# Patient Record
Sex: Female | Born: 1980 | Race: Black or African American | Hispanic: No | Marital: Single | Smoking: Former smoker
Health system: Southern US, Community
[De-identification: ages and names within clinical notes are randomized; demographics above are authoritative.]

## PROBLEM LIST (undated history)

## (undated) DIAGNOSIS — D573 Sickle-cell trait: Secondary | ICD-10-CM

## (undated) DIAGNOSIS — E282 Polycystic ovarian syndrome: Secondary | ICD-10-CM

## (undated) DIAGNOSIS — B009 Herpesviral infection, unspecified: Secondary | ICD-10-CM

## (undated) DIAGNOSIS — F419 Anxiety disorder, unspecified: Secondary | ICD-10-CM

## (undated) DIAGNOSIS — I10 Essential (primary) hypertension: Secondary | ICD-10-CM

## (undated) DIAGNOSIS — R319 Hematuria, unspecified: Secondary | ICD-10-CM

## (undated) DIAGNOSIS — E119 Type 2 diabetes mellitus without complications: Secondary | ICD-10-CM

## (undated) HISTORY — PX: FINGER SURGERY: SHX640

## (undated) HISTORY — PX: INDUCED ABORTION: SHX677

---

## 1999-08-07 ENCOUNTER — Other Ambulatory Visit: Admission: RE | Admit: 1999-08-07 | Discharge: 1999-08-07 | Payer: Self-pay | Admitting: Obstetrics and Gynecology

## 1999-08-08 ENCOUNTER — Other Ambulatory Visit: Admission: RE | Admit: 1999-08-08 | Discharge: 1999-08-08 | Payer: Self-pay | Admitting: Obstetrics and Gynecology

## 1999-12-10 ENCOUNTER — Other Ambulatory Visit: Admission: RE | Admit: 1999-12-10 | Discharge: 1999-12-10 | Payer: Self-pay | Admitting: Obstetrics and Gynecology

## 1999-12-10 ENCOUNTER — Encounter (INDEPENDENT_AMBULATORY_CARE_PROVIDER_SITE_OTHER): Payer: Self-pay

## 2000-04-09 ENCOUNTER — Other Ambulatory Visit: Admission: RE | Admit: 2000-04-09 | Discharge: 2000-04-09 | Payer: Self-pay | Admitting: Obstetrics and Gynecology

## 2004-01-16 ENCOUNTER — Inpatient Hospital Stay (HOSPITAL_COMMUNITY): Admission: AD | Admit: 2004-01-16 | Discharge: 2004-01-16 | Payer: Self-pay | Admitting: Obstetrics

## 2004-01-17 ENCOUNTER — Inpatient Hospital Stay (HOSPITAL_COMMUNITY): Admission: AD | Admit: 2004-01-17 | Discharge: 2004-01-20 | Payer: Self-pay | Admitting: Obstetrics

## 2006-02-25 ENCOUNTER — Emergency Department (HOSPITAL_COMMUNITY): Admission: EM | Admit: 2006-02-25 | Discharge: 2006-02-25 | Payer: Self-pay | Admitting: Family Medicine

## 2009-10-04 ENCOUNTER — Emergency Department (HOSPITAL_COMMUNITY): Admission: EM | Admit: 2009-10-04 | Discharge: 2009-10-04 | Payer: Self-pay | Admitting: Family Medicine

## 2009-10-08 ENCOUNTER — Ambulatory Visit (HOSPITAL_COMMUNITY): Admission: RE | Admit: 2009-10-08 | Discharge: 2009-10-08 | Payer: Self-pay | Admitting: Orthopedic Surgery

## 2009-10-09 ENCOUNTER — Ambulatory Visit (HOSPITAL_BASED_OUTPATIENT_CLINIC_OR_DEPARTMENT_OTHER): Admission: RE | Admit: 2009-10-09 | Discharge: 2009-10-09 | Payer: Self-pay | Admitting: Orthopedic Surgery

## 2010-02-02 ENCOUNTER — Encounter: Payer: Self-pay | Admitting: Obstetrics & Gynecology

## 2010-03-28 LAB — CBC
HCT: 36.3 % (ref 36.0–46.0)
Hemoglobin: 12.3 g/dL (ref 12.0–15.0)
MCH: 26.6 pg (ref 26.0–34.0)
MCHC: 33.9 g/dL (ref 30.0–36.0)
MCV: 78.6 fL (ref 78.0–100.0)
Platelets: 264 10*3/uL (ref 150–400)
RBC: 4.62 MIL/uL (ref 3.87–5.11)
RDW: 16.1 % — ABNORMAL HIGH (ref 11.5–15.5)
WBC: 15.3 10*3/uL — ABNORMAL HIGH (ref 4.0–10.5)

## 2010-03-28 LAB — BASIC METABOLIC PANEL
BUN: 3 mg/dL — ABNORMAL LOW (ref 6–23)
CO2: 26 mEq/L (ref 19–32)
Calcium: 8.9 mg/dL (ref 8.4–10.5)
Chloride: 108 mEq/L (ref 96–112)
Creatinine, Ser: 0.84 mg/dL (ref 0.4–1.2)
GFR calc Af Amer: >60 mL/min (ref >60)
GFR calc non Af Amer: >60 mL/min (ref >60)
Glucose, Bld: 100 mg/dL — ABNORMAL HIGH (ref 70–99)
Potassium: 4 mEq/L (ref 3.5–5.1)
Sodium: 138 mEq/L (ref 135–145)

## 2012-12-30 ENCOUNTER — Other Ambulatory Visit: Payer: Self-pay | Admitting: Nurse Practitioner

## 2012-12-30 DIAGNOSIS — R109 Unspecified abdominal pain: Secondary | ICD-10-CM

## 2013-01-03 ENCOUNTER — Ambulatory Visit
Admission: RE | Admit: 2013-01-03 | Discharge: 2013-01-03 | Disposition: A | Discharge status: Home / Self Care | Payer: BC Managed Care – PPO | Source: Ambulatory Visit | Attending: Nurse Practitioner | Admitting: Nurse Practitioner

## 2013-01-03 ENCOUNTER — Other Ambulatory Visit: Payer: Self-pay | Admitting: Nurse Practitioner

## 2013-01-03 ENCOUNTER — Other Ambulatory Visit: Payer: Self-pay

## 2013-01-03 DIAGNOSIS — R109 Unspecified abdominal pain: Secondary | ICD-10-CM

## 2013-01-03 DIAGNOSIS — R319 Hematuria, unspecified: Secondary | ICD-10-CM

## 2013-11-16 ENCOUNTER — Other Ambulatory Visit: Payer: Self-pay | Admitting: Gastroenterology

## 2013-11-16 DIAGNOSIS — R1011 Right upper quadrant pain: Secondary | ICD-10-CM

## 2013-12-05 ENCOUNTER — Ambulatory Visit (HOSPITAL_COMMUNITY)
Admission: RE | Admit: 2013-12-05 | Discharge: 2013-12-05 | Disposition: A | Discharge status: Home / Self Care | Payer: BC Managed Care – PPO | Source: Ambulatory Visit | Attending: Gastroenterology | Admitting: Gastroenterology

## 2013-12-05 ENCOUNTER — Encounter (HOSPITAL_COMMUNITY)
Admission: RE | Admit: 2013-12-05 | Discharge: 2013-12-05 | Disposition: A | Discharge status: Home / Self Care | Payer: BC Managed Care – PPO | Source: Ambulatory Visit | Attending: Gastroenterology | Admitting: Gastroenterology

## 2013-12-05 DIAGNOSIS — R1011 Right upper quadrant pain: Secondary | ICD-10-CM | POA: Insufficient documentation

## 2013-12-05 MED ORDER — STERILE WATER FOR INJECTION IJ SOLN
INTRAMUSCULAR | Status: AC
  Filled 2013-12-05: qty 10

## 2013-12-05 MED ORDER — TECHNETIUM TC 99M MEBROFENIN IV KIT: 5.0000 | PACK | Freq: Once | INTRAVENOUS | Status: AC | PRN

## 2013-12-05 MED ORDER — SINCALIDE 5 MCG IJ SOLR
INTRAMUSCULAR | Status: AC
  Administered 2013-12-05: 1.99 ug
  Filled 2013-12-05: qty 5

## 2013-12-14 ENCOUNTER — Encounter (HOSPITAL_COMMUNITY): Admission: RE | Payer: Self-pay | Source: Ambulatory Visit

## 2013-12-14 ENCOUNTER — Ambulatory Visit (HOSPITAL_COMMUNITY)
Admission: RE | Admit: 2013-12-14 | Payer: BC Managed Care – PPO | Source: Ambulatory Visit | Admitting: Obstetrics & Gynecology

## 2013-12-14 SURGERY — ROBOTIC ASSISTED LAPAROSCOPIC OVARIAN CYSTECTOMY
Anesthesia: General | Laterality: Right

## 2014-12-05 ENCOUNTER — Other Ambulatory Visit: Payer: Self-pay | Admitting: Urology

## 2014-12-13 ENCOUNTER — Encounter (HOSPITAL_COMMUNITY): Admission: RE | Payer: Self-pay | Source: Ambulatory Visit

## 2014-12-13 ENCOUNTER — Ambulatory Visit (HOSPITAL_COMMUNITY): Admission: RE | Admit: 2014-12-13 | Payer: BC Managed Care – PPO | Source: Ambulatory Visit | Admitting: Urology

## 2014-12-13 SURGERY — CYSTOURETEROSCOPY, WITH RETROGRADE PYELOGRAM AND STENT INSERTION
Anesthesia: General | Laterality: Bilateral

## 2015-09-22 ENCOUNTER — Ambulatory Visit (HOSPITAL_COMMUNITY)
Admission: EM | Admit: 2015-09-22 | Discharge: 2015-09-22 | Discharge status: Absent without leave | Payer: BC Managed Care – PPO

## 2015-09-22 ENCOUNTER — Encounter (HOSPITAL_COMMUNITY): Payer: Self-pay | Admitting: *Deleted

## 2015-09-22 ENCOUNTER — Inpatient Hospital Stay (HOSPITAL_COMMUNITY)
Admission: AD | Admit: 2015-09-22 | Discharge: 2015-09-22 | Disposition: A | Discharge status: Home / Self Care | Payer: BC Managed Care – PPO | Source: Ambulatory Visit | Attending: Obstetrics and Gynecology | Admitting: Obstetrics and Gynecology

## 2015-09-22 DIAGNOSIS — D573 Sickle-cell trait: Secondary | ICD-10-CM | POA: Diagnosis not present

## 2015-09-22 DIAGNOSIS — Z8249 Family history of ischemic heart disease and other diseases of the circulatory system: Secondary | ICD-10-CM | POA: Insufficient documentation

## 2015-09-22 DIAGNOSIS — Z3202 Encounter for pregnancy test, result negative: Secondary | ICD-10-CM | POA: Diagnosis not present

## 2015-09-22 DIAGNOSIS — Z7984 Long term (current) use of oral hypoglycemic drugs: Secondary | ICD-10-CM | POA: Diagnosis not present

## 2015-09-22 DIAGNOSIS — Z87891 Personal history of nicotine dependence: Secondary | ICD-10-CM | POA: Insufficient documentation

## 2015-09-22 DIAGNOSIS — E282 Polycystic ovarian syndrome: Secondary | ICD-10-CM | POA: Insufficient documentation

## 2015-09-22 DIAGNOSIS — R51 Headache: Secondary | ICD-10-CM | POA: Diagnosis not present

## 2015-09-22 DIAGNOSIS — N939 Abnormal uterine and vaginal bleeding, unspecified: Secondary | ICD-10-CM | POA: Diagnosis not present

## 2015-09-22 DIAGNOSIS — Z91040 Latex allergy status: Secondary | ICD-10-CM | POA: Insufficient documentation

## 2015-09-22 DIAGNOSIS — Z8619 Personal history of other infectious and parasitic diseases: Secondary | ICD-10-CM | POA: Diagnosis not present

## 2015-09-22 DIAGNOSIS — IMO0001 Reserved for inherently not codable concepts without codable children: Secondary | ICD-10-CM

## 2015-09-22 DIAGNOSIS — Z823 Family history of stroke: Secondary | ICD-10-CM | POA: Insufficient documentation

## 2015-09-22 DIAGNOSIS — N938 Other specified abnormal uterine and vaginal bleeding: Secondary | ICD-10-CM | POA: Diagnosis not present

## 2015-09-22 DIAGNOSIS — Z880 Allergy status to penicillin: Secondary | ICD-10-CM | POA: Insufficient documentation

## 2015-09-22 HISTORY — DX: Hematuria, unspecified: R31.9

## 2015-09-22 HISTORY — DX: Sickle-cell trait: D57.3

## 2015-09-22 HISTORY — DX: Polycystic ovarian syndrome: E28.2

## 2015-09-22 HISTORY — DX: Herpesviral infection, unspecified: B00.9

## 2015-09-22 LAB — URINALYSIS, ROUTINE W REFLEX MICROSCOPIC
Bilirubin Urine: NEGATIVE
Glucose, UA: NEGATIVE mg/dL
Ketones, ur: NEGATIVE mg/dL
Leukocytes, UA: NEGATIVE
Nitrite: NEGATIVE
Protein, ur: NEGATIVE mg/dL
Specific Gravity, Urine: 1.015 (ref 1.005–1.030)
pH: 6.5 (ref 5.0–8.0)

## 2015-09-22 LAB — URINE MICROSCOPIC-ADD ON

## 2015-09-22 LAB — CBC
HCT: 33 % — ABNORMAL LOW (ref 36.0–46.0)
Hemoglobin: 11.1 g/dL — ABNORMAL LOW (ref 12.0–15.0)
MCH: 25.7 pg — ABNORMAL LOW (ref 26.0–34.0)
MCHC: 33.6 g/dL (ref 30.0–36.0)
MCV: 76.4 fL — ABNORMAL LOW (ref 78.0–100.0)
Platelets: 315 10*3/uL (ref 150–400)
RBC: 4.32 MIL/uL (ref 3.87–5.11)
RDW: 17 % — ABNORMAL HIGH (ref 11.5–15.5)
WBC: 9.1 10*3/uL (ref 4.0–10.5)

## 2015-09-22 LAB — POCT PREGNANCY, URINE: Preg Test, Ur: NEGATIVE

## 2015-09-22 LAB — HCG, SERUM, QUALITATIVE: Preg, Serum: NEGATIVE

## 2015-09-22 NOTE — MAU Provider Note (Signed)
History     CSN: [id number]  Arrival date and time: 09/22/15 [id number]   First Provider Initiated Contact with Patient 09/22/15 2032       Chief Complaint  Patient presents with  . Possible Pregnancy  . Vaginal Bleeding   HPI  Dawn Friedman is a 35 y.o. 112P1011 female who presents with vaginal bleeding. LMP 08/26/15; had negative pregnancy tests at home. Vaginal spotting since Thursday that increased today. Thought she was pregnant d/t headaches, fatigue, nausea and "smells bothering her". Was on OCPs for AUB control but stopped taking them in June d/t continued bleeding.  Goes to [facility] for gyn care; last seen there Summer 2016.    OB History    Gravida Para Term Preterm AB Living   2 1 1   1 1    SAB TAB Ectopic Multiple Live Births     1     1      Past Medical History:  Diagnosis Date  . Hematuria   . HSV infection   . PCOS (polycystic ovarian syndrome)   . Sickle cell trait [facility](HCC)     Past Surgical History:  Procedure Laterality Date  . FINGER SURGERY    . INDUCED ABORTION      Family History  Problem Relation Age of Onset  . Cancer Mother   . Cancer Father   . Heart disease Father   . COPD Father   . Stroke Father   . Stroke Brother     Social History  Substance Use Topics  . Smoking status: Former Games developermoker  . Smokeless tobacco: Never Used  . Alcohol use Yes     Comment: social    Allergies:  Allergies  Allergen Reactions  . Amoxicillin Rash    Has patient had a PCN reaction causing immediate rash, facial/tongue/throat swelling, SOB or lightheadedness with hypotension: No Has patient had a PCN reaction causing severe rash involving mucus membranes or skin necrosis: No Has patient had a PCN reaction that required hospitalization No Has patient had a PCN reaction occurring within the last 10 years: No If all of the above answers are "NO", then may proceed with Cephalosporin use.   . Latex Other (See Comments)    Irritation and dries her skin out     Prescriptions Prior to Admission  Medication Sig Dispense Refill Last Dose  . acetaminophen (TYLENOL) 325 MG suppository Place 325 mg rectally every 4 (four) hours as needed.     Marland Kitchen. acetaminophen (TYLENOL) 325 MG tablet Take 650 mg by mouth every 6 (six) hours as needed.   Past Week at Unknown time  . ibuprofen (ADVIL,MOTRIN) 800 MG tablet Take 800 mg by mouth every 8 (eight) hours as needed. Pain  0 Past Month at Unknown time  . pantoprazole (PROTONIX) 20 MG tablet Take 20 mg by mouth daily.   Past Month at Unknown time  . polyethylene glycol (MIRALAX / GLYCOLAX) packet Take 17 g by mouth daily.   Past Week at Unknown time  . venlafaxine XR (EFFEXOR-XR) 75 MG 24 hr capsule Take 75 mg by mouth daily with breakfast.   Past Week at Unknown time  . vitamin B-12 (CYANOCOBALAMIN) 100 MCG tablet Take 100 mcg by mouth daily.   09/21/2015 at Unknown time  . JUNEL FE 1/20 1-20 MG-MCG tablet Take 1 tablet by mouth daily.  11   . Melatonin 3 MG TABS Take 3 mg by mouth at bedtime.     . metFORMIN (GLUCOPHAGE-XR) 750  MG 24 hr tablet Take 750 mg by mouth daily with breakfast.     . spironolactone (ALDACTONE) 50 MG tablet Take 50 mg by mouth daily.     . valACYclovir (VALTREX) 500 MG tablet Take 1,000 mg by mouth See admin instructions. Takes daily for 3-5 days for outbreaks  0 More than a month at Unknown time    Review of Systems  Constitutional: Positive for malaise/fatigue. Negative for fever and weight loss.  Gastrointestinal: Positive for nausea. Negative for abdominal pain, constipation, diarrhea and vomiting.  Genitourinary: Negative for dysuria.       + vaginal bleeding  Neurological: Positive for headaches.   Physical Exam   Blood pressure 132/92, pulse 69, temperature 98.2 F (36.8 C), resp. rate 18, height 5\' 7"  (1.702 m), weight 159 lb 12.8 oz (72.5 kg).  Physical Exam  Nursing note and vitals reviewed. Constitutional: She is oriented to person, place, and time. She appears  well-developed and well-nourished. No distress.  HENT:  Head: Normocephalic and atraumatic.  Eyes: Conjunctivae are normal. Right eye exhibits no discharge. Left eye exhibits no discharge. No scleral icterus.  Neck: Normal range of motion.  Cardiovascular: Normal rate, regular rhythm and normal heart sounds.   No murmur heard. Respiratory: Effort normal and breath sounds normal. No respiratory distress. She has no wheezes.  GI: Soft. Bowel sounds are normal. She exhibits no distension. There is no tenderness.  Neurological: She is alert and oriented to person, place, and time.  Skin: Skin is warm and dry. She is not diaphoretic.  Psychiatric: She has a normal mood and affect. Her behavior is normal. Judgment and thought content normal.    MAU Course  Procedures Results for orders placed or performed during the hospital encounter of 09/22/15 (from the past 24 hour(s))  Urinalysis, Routine w reflex microscopic (not at [facility])     Status: Abnormal   Collection Time: 09/22/15  7:45 PM  Result Value Ref Range   Color, Urine YELLOW YELLOW   APPearance CLEAR CLEAR   Specific Gravity, Urine 1.015 1.005 - 1.030   pH 6.5 5.0 - 8.0   Glucose, UA NEGATIVE NEGATIVE mg/dL   Hgb urine dipstick SMALL (A) NEGATIVE   Bilirubin Urine NEGATIVE NEGATIVE   Ketones, ur NEGATIVE NEGATIVE mg/dL   Protein, ur NEGATIVE NEGATIVE mg/dL   Nitrite NEGATIVE NEGATIVE   Leukocytes, UA NEGATIVE NEGATIVE  Urine microscopic-add on     Status: Abnormal   Collection Time: 09/22/15  7:45 PM  Result Value Ref Range   Squamous Epithelial / LPF 0-5 (A) NONE SEEN   WBC, UA 0-5 0 - 5 WBC/hpf   RBC / HPF 6-30 0 - 5 RBC/hpf   Bacteria, UA RARE (A) NONE SEEN  Pregnancy, urine POC     Status: None   Collection Time: 09/22/15  8:08 PM  Result Value Ref Range   Preg Test, Ur NEGATIVE NEGATIVE  CBC     Status: Abnormal   Collection Time: 09/22/15  8:52 PM  Result Value Ref Range   WBC 9.1 4.0 - 10.5 K/uL   RBC 4.32 3.87 -  5.11 MIL/uL   Hemoglobin 11.1 (L) 12.0 - 15.0 g/dL   HCT 16.1 (L) 09.6 - 04.5 %   MCV 76.4 (L) 78.0 - 100.0 fL   MCH 25.7 (L) 26.0 - 34.0 pg   MCHC 33.6 30.0 - 36.0 g/dL   RDW 40.9 (H) 81.1 - 91.4 %   Platelets 315 150 - 400 K/uL  hCG,  serum, qualitative     Status: None   Collection Time: 09/22/15  8:52 PM  Result Value Ref Range   Preg, Serum NEGATIVE NEGATIVE    MDM UPT & serum preg negative CBC stable Pt declined exam S/w Dr. Billy Coast; ok to discharge home. Pt should call office for follow up.   Assessment and Plan  A: 1. Onset of menses   2. Negative pregnancy test     P: Discharge home Call office on Monday for f/u Take tylenol prn headaches    Judeth Horn 09/22/2015, 8:32 PM

## 2015-09-22 NOTE — Discharge Instructions (Signed)

## 2015-09-22 NOTE — MAU Note (Addendum)
Think I may be pregnant. Di d upt and was negative. Woke up this am and gushed blood. Everytime I pee since then I have seen blood and know is vaginal. LMP 8/13. Stopped birth control pills in June due to cont vag bleeding. Occ headaches, some back pain, occ cramping but no pain now. Hx R ovarian cyst and fibroids

## 2015-09-22 NOTE — Progress Notes (Signed)
Erin Lawrence NP in earlier to discuss test results and d/c plan. Written and verbal d/c instructions given and understanding voiced 

## 2019-01-14 NOTE — L&D Delivery Note (Signed)
Called with patient 9.5 cm and I arrived in 4 minutes to a precipitous delivery by the faculty provider. I took over before the cord was clamped. Delivery Note  SVD viable female Apgars 9,9 over intact perineum.  Placenta delivered spontaneously intact with 3VC. Good support and hemostasis noted.  R/V exam confirms.  PH art was sent.   Mother and baby to couplet care and are doing well.  EBL 100cc  [name], MD

## 2019-01-14 NOTE — L&D Delivery Note (Signed)
Delivery Note  Called to room by RN as patient was 9.5 cm and feeling urge to push. Dr. Rana Snare en route. At 6:11 PM a viable female was delivered via Vaginal, Spontaneous (Presentation:  OA    ).  APGAR: , 9; weight  Pending.  Dr. Rana Snare assumes care of  Patient before cord is cut and clamped.    Dawn Friedman 08/26/2019, 6:26 PM

## 2019-02-03 LAB — OB RESULTS CONSOLE GC/CHLAMYDIA
Chlamydia: NEGATIVE
Gonorrhea: NEGATIVE

## 2019-02-03 LAB — OB RESULTS CONSOLE HIV ANTIBODY (ROUTINE TESTING): HIV: NONREACTIVE

## 2019-02-03 LAB — OB RESULTS CONSOLE HEPATITIS B SURFACE ANTIGEN: Hepatitis B Surface Ag: NEGATIVE

## 2019-02-03 LAB — OB RESULTS CONSOLE ABO/RH: RH Type: POSITIVE

## 2019-02-03 LAB — OB RESULTS CONSOLE RUBELLA ANTIBODY, IGM: Rubella: IMMUNE

## 2019-02-03 LAB — OB RESULTS CONSOLE RPR: RPR: NONREACTIVE

## 2019-07-14 DIAGNOSIS — Z419 Encounter for procedure for purposes other than remedying health state, unspecified: Secondary | ICD-10-CM | POA: Diagnosis not present

## 2019-07-14 DIAGNOSIS — O10013 Pre-existing essential hypertension complicating pregnancy, third trimester: Secondary | ICD-10-CM | POA: Diagnosis not present

## 2019-07-14 DIAGNOSIS — Z3400 Encounter for supervision of normal first pregnancy, unspecified trimester: Secondary | ICD-10-CM | POA: Diagnosis not present

## 2019-07-14 DIAGNOSIS — Z3A32 32 weeks gestation of pregnancy: Secondary | ICD-10-CM | POA: Diagnosis not present

## 2019-07-21 DIAGNOSIS — O99891 Other specified diseases and conditions complicating pregnancy: Secondary | ICD-10-CM | POA: Diagnosis not present

## 2019-07-21 DIAGNOSIS — Z3400 Encounter for supervision of normal first pregnancy, unspecified trimester: Secondary | ICD-10-CM | POA: Diagnosis not present

## 2019-07-21 DIAGNOSIS — Z3A33 33 weeks gestation of pregnancy: Secondary | ICD-10-CM | POA: Diagnosis not present

## 2019-07-25 DIAGNOSIS — O99891 Other specified diseases and conditions complicating pregnancy: Secondary | ICD-10-CM | POA: Diagnosis not present

## 2019-07-25 DIAGNOSIS — Z3A33 33 weeks gestation of pregnancy: Secondary | ICD-10-CM | POA: Diagnosis not present

## 2019-07-25 DIAGNOSIS — Z3400 Encounter for supervision of normal first pregnancy, unspecified trimester: Secondary | ICD-10-CM | POA: Diagnosis not present

## 2019-07-29 ENCOUNTER — Encounter (HOSPITAL_COMMUNITY): Payer: Self-pay | Admitting: Obstetrics and Gynecology

## 2019-07-29 ENCOUNTER — Other Ambulatory Visit: Payer: Self-pay

## 2019-07-29 ENCOUNTER — Observation Stay (HOSPITAL_COMMUNITY)
Admission: AD | Admit: 2019-07-29 | Discharge: 2019-07-30 | Disposition: A | Discharge status: Home / Self Care | Payer: 59 | Attending: Obstetrics and Gynecology | Admitting: Obstetrics and Gynecology

## 2019-07-29 DIAGNOSIS — O99013 Anemia complicating pregnancy, third trimester: Secondary | ICD-10-CM | POA: Diagnosis not present

## 2019-07-29 DIAGNOSIS — O139 Gestational [pregnancy-induced] hypertension without significant proteinuria, unspecified trimester: Secondary | ICD-10-CM | POA: Diagnosis present

## 2019-07-29 DIAGNOSIS — O10913 Unspecified pre-existing hypertension complicating pregnancy, third trimester: Secondary | ICD-10-CM

## 2019-07-29 DIAGNOSIS — Z7982 Long term (current) use of aspirin: Secondary | ICD-10-CM | POA: Diagnosis not present

## 2019-07-29 DIAGNOSIS — Z3A34 34 weeks gestation of pregnancy: Secondary | ICD-10-CM | POA: Insufficient documentation

## 2019-07-29 DIAGNOSIS — O133 Gestational [pregnancy-induced] hypertension without significant proteinuria, third trimester: Principal | ICD-10-CM | POA: Insufficient documentation

## 2019-07-29 DIAGNOSIS — Z87891 Personal history of nicotine dependence: Secondary | ICD-10-CM | POA: Insufficient documentation

## 2019-07-29 DIAGNOSIS — Z3689 Encounter for other specified antenatal screening: Secondary | ICD-10-CM

## 2019-07-29 DIAGNOSIS — Z79899 Other long term (current) drug therapy: Secondary | ICD-10-CM | POA: Diagnosis not present

## 2019-07-29 DIAGNOSIS — Z20822 Contact with and (suspected) exposure to covid-19: Secondary | ICD-10-CM | POA: Insufficient documentation

## 2019-07-29 DIAGNOSIS — O10919 Unspecified pre-existing hypertension complicating pregnancy, unspecified trimester: Secondary | ICD-10-CM

## 2019-07-29 DIAGNOSIS — D573 Sickle-cell trait: Secondary | ICD-10-CM | POA: Insufficient documentation

## 2019-07-29 LAB — COMPREHENSIVE METABOLIC PANEL
ALT: 14 U/L (ref 0–44)
AST: 18 U/L (ref 15–41)
Albumin: 2.6 g/dL — ABNORMAL LOW (ref 3.5–5.0)
Alkaline Phosphatase: 103 U/L (ref 38–126)
Anion gap: 10 (ref 5–15)
BUN: <5 mg/dL — ABNORMAL LOW (ref 6–20)
CO2: 22 mmol/L (ref 22–32)
Calcium: 9.7 mg/dL (ref 8.9–10.3)
Chloride: 106 mmol/L (ref 98–111)
Creatinine, Ser: 0.7 mg/dL (ref 0.44–1.00)
GFR calc Af Amer: >60 mL/min (ref >60)
GFR calc non Af Amer: >60 mL/min (ref >60)
Glucose, Bld: 90 mg/dL (ref 70–99)
Potassium: 3.8 mmol/L (ref 3.5–5.1)
Sodium: 138 mmol/L (ref 135–145)
Total Bilirubin: 0.1 mg/dL — ABNORMAL LOW (ref 0.3–1.2)
Total Protein: 6.2 g/dL — ABNORMAL LOW (ref 6.5–8.1)

## 2019-07-29 LAB — TYPE AND SCREEN
ABO/RH(D): O POS
Antibody Screen: NEGATIVE

## 2019-07-29 LAB — PROTEIN / CREATININE RATIO, URINE
Creatinine, Urine: 150.16 mg/dL
Protein Creatinine Ratio: 0.11 mg/mg{Cre} (ref 0.00–0.15)
Total Protein, Urine: 16 mg/dL

## 2019-07-29 LAB — CBC
HCT: 40.9 % (ref 36.0–46.0)
Hemoglobin: 13.5 g/dL (ref 12.0–15.0)
MCH: 27.4 pg (ref 26.0–34.0)
MCHC: 33 g/dL (ref 30.0–36.0)
MCV: 83.1 fL (ref 80.0–100.0)
Platelets: 285 10*3/uL (ref 150–400)
RBC: 4.92 MIL/uL (ref 3.87–5.11)
RDW: 14.3 % (ref 11.5–15.5)
WBC: 14.2 10*3/uL — ABNORMAL HIGH (ref 4.0–10.5)
nRBC: 0 % (ref 0.0–0.2)

## 2019-07-29 LAB — ABO/RH: ABO/RH(D): O POS

## 2019-07-29 LAB — AMNISURE RUPTURE OF MEMBRANE (ROM) NOT AT ARMC: Amnisure ROM: NEGATIVE

## 2019-07-29 LAB — POC URINE PREG, ED: Preg Test, Ur: POSITIVE — AB

## 2019-07-29 LAB — SARS CORONAVIRUS 2 BY RT PCR (HOSPITAL ORDER, PERFORMED IN ~~LOC~~ HOSPITAL LAB): SARS Coronavirus 2: NEGATIVE

## 2019-07-29 MED ORDER — PRENATAL MULTIVITAMIN CH
1.0000 | ORAL_TABLET | Freq: Every day | ORAL | Status: DC
  Administered 2019-07-30: 1 via ORAL
  Filled 2019-07-29: qty 1

## 2019-07-29 MED ORDER — DOCUSATE SODIUM 100 MG PO CAPS
100.0000 mg | ORAL_CAPSULE | Freq: Every day | ORAL | Status: DC
  Administered 2019-07-30: 100 mg via ORAL
  Filled 2019-07-29: qty 1

## 2019-07-29 MED ORDER — HYDRALAZINE HCL 20 MG/ML IJ SOLN: 10.0000 mg | INTRAMUSCULAR | Status: DC | PRN

## 2019-07-29 MED ORDER — BETAMETHASONE SOD PHOS & ACET 6 (3-3) MG/ML IJ SUSP
12.0000 mg | INTRAMUSCULAR | Status: DC
  Administered 2019-07-29: 12 mg via INTRAMUSCULAR
  Filled 2019-07-29: qty 5

## 2019-07-29 MED ORDER — ZOLPIDEM TARTRATE 5 MG PO TABS
5.0000 mg | ORAL_TABLET | Freq: Every evening | ORAL | Status: DC | PRN
  Administered 2019-07-30: 5 mg via ORAL
  Filled 2019-07-29 (×2): qty 1

## 2019-07-29 MED ORDER — NIFEDIPINE ER OSMOTIC RELEASE 30 MG PO TB24
60.0000 mg | ORAL_TABLET | Freq: Every day | ORAL | Status: DC
  Administered 2019-07-29 – 2019-07-30 (×2): 60 mg via ORAL
  Filled 2019-07-29 (×2): qty 2

## 2019-07-29 MED ORDER — LABETALOL HCL 5 MG/ML IV SOLN: 20.0000 mg | INTRAVENOUS | Status: DC | PRN

## 2019-07-29 MED ORDER — CALCIUM CARBONATE ANTACID 500 MG PO CHEW
2.0000 | CHEWABLE_TABLET | ORAL | Status: DC | PRN
  Administered 2019-07-30: 400 mg via ORAL
  Filled 2019-07-29: qty 2

## 2019-07-29 MED ORDER — LABETALOL HCL 5 MG/ML IV SOLN: 40.0000 mg | INTRAVENOUS | Status: DC | PRN

## 2019-07-29 MED ORDER — HYDRALAZINE HCL 20 MG/ML IJ SOLN: 5.0000 mg | INTRAMUSCULAR | Status: DC | PRN

## 2019-07-29 MED ORDER — ACETAMINOPHEN 325 MG PO TABS: 650.0000 mg | ORAL_TABLET | ORAL | Status: DC | PRN

## 2019-07-29 NOTE — MAU Note (Signed)
PT SAYS  SHE THINKS SROM AT 1230NOON. NO FLUID NOW.  PNC -  WITH PFW .  FEELS BABY MOVE. LAST SEX- DEC .

## 2019-07-29 NOTE — ED Provider Notes (Addendum)
39 year old female G2 P1 [redacted] weeks gestation presents to the ER with complaints of leaking vaginal fluid.  Patient states that earlier today as she was running errands, she was sitting in the car noticed that her seat and pants were soaked in fluid.  She denies any abdominal pain, contractions, cramping, bleeding, no mucous plug passing.  She had some associated nausea in the afternoon, but no vomiting. She called her OBGYN and was told to come to the  ER to be evaluated.  She denies any fevers or chills, dysuria, blood in her urine, headache, back pain.  Previous pregnancy was a term.  There have been no significant complications with the pregnancy.  On presentation to the ER, she is overall well-appearing, nontoxic-appearing, no acute distress.  She was slightly tachycardic on arrival, blood pressure was 148/107.  She was physically anxious in the ED.  Other vitals reassuring.  Abdomen nontender.  Overall stable to be transferred over to MAU for further evaluation.      Mare Ferrari, PA-C 07/29/19 1857    Alvira Monday, MD 07/30/19 1057

## 2019-07-29 NOTE — ED Triage Notes (Signed)
Pt arrives to ED b/c she feels like her water broke. Pt [redacted] weeks pregnant. Pt denies abdominal pain, contractions, vaginal bleeding.

## 2019-07-29 NOTE — MAU Provider Note (Signed)
History     CSN: [id number]  Arrival date and time: 07/29/19 1811   First Provider Initiated Contact with Patient 07/29/19 2037      Chief Complaint  Patient presents with  . Vaginal Discharge  . Rupture of Membranes   Ms. Barbara Cowershley N Turcott is a 39 y.o. G3P1011 at [id number] who presents to MAU for preeclampsia evaluation. Patient denies issues with blood pressure, but does report it was elevated at the beginning of pregnancy and believes she was given a diagnosis of cHTN. Patient reports she is currently taking Procardia, which she started yesterday. Patient reports she tried labetalol, but reports that it made her nauseous and generally gave her the feeling of being unwell. Patient reports she just started taking BP medication a little over a week ago.  Patient reports she has a headache today. Patient thought it was because she didn't eat, but after she ate the headache worsened. Patient now reports the headache is intermittent and rates as 0/10.  Pt denies blurry vision/seeing spots, N/V, epigastric pain, swelling in face and hands, sudden weight gain. Pt denies chest pain and SOB.  Pt denies constipation, diarrhea, or urinary problems. Pt denies fever, chills, fatigue, sweating or changes in appetite. Pt denies dizziness, light-headedness, weakness.  Pt denies VB, ctx, and reports good FM.  Patient also reports possible ROM. Patient reports she was driving in her car and when she got home she noticed that her underwear and clothes were soaked. Patient denies any LOF since that time. Patient reports a clear, odorless fluid. Patient denies wearing a pad.  Current pregnancy problems? cHTN Blood Type? none Allergies? AMOX, latex Current medications? Sertraline, Procardia, bASA, PNVs, folic acid, Unisom Current PNC & next appt? [facility], Friday 08/05/2019   OB History    Gravida  3   Para  1   Term  1   Preterm      AB  1   Living  1     SAB      TAB  1    Ectopic      Multiple      Live Births  1           Past Medical History:  Diagnosis Date  . Hematuria   . HSV infection   . PCOS (polycystic ovarian syndrome)   . Sickle cell trait [facility](HCC)     Past Surgical History:  Procedure Laterality Date  . FINGER SURGERY    . INDUCED ABORTION      Family History  Problem Relation Age of Onset  . Cancer Mother   . Cancer Father   . Heart disease Father   . COPD Father   . Stroke Father   . Stroke Brother     Social History   Tobacco Use  . Smoking status: Former Games developermoker  . Smokeless tobacco: Never Used  Substance Use Topics  . Alcohol use: Not Currently    Comment: social  . Drug use: No    Allergies:  Allergies  Allergen Reactions  . Amoxicillin Rash    Has patient had a PCN reaction causing immediate rash, facial/tongue/throat swelling, SOB or lightheadedness with hypotension: No Has patient had a PCN reaction causing severe rash involving mucus membranes or skin necrosis: No Has patient had a PCN reaction that required hospitalization No Has patient had a PCN reaction occurring within the last 10 years: No If all of the above answers are "NO", then may proceed with Cephalosporin use.   .Marland Kitchen  Latex Other (See Comments)    Irritation and dries her skin out    Medications Prior to Admission  Medication Sig Dispense Refill Last Dose  . acetaminophen (TYLENOL) 325 MG tablet Take 650 mg by mouth every 6 (six) hours as needed.   07/28/2019 at Unknown time  . aspirin EC 81 MG tablet Take 81 mg by mouth daily. Swallow whole.     . folic acid (FOLVITE) 1 MG tablet Take 1 mg by mouth daily.     Marland Kitchen NIFEdipine (ADALAT CC) 30 MG 24 hr tablet Take 30 mg by mouth daily.     . Prenatal Vit-Fe Fumarate-FA (PRENATAL MULTIVITAMIN) TABS tablet Take 1 tablet by mouth daily at 12 noon.     . sertraline (ZOLOFT) 25 MG tablet Take 25 mg by mouth daily.     Marland Kitchen ibuprofen (ADVIL,MOTRIN) 800 MG tablet Take 800 mg by mouth every 8 (eight) hours  as needed. Pain  0   . pantoprazole (PROTONIX) 20 MG tablet Take 20 mg by mouth daily.     . polyethylene glycol (MIRALAX / GLYCOLAX) packet Take 17 g by mouth daily.     . valACYclovir (VALTREX) 500 MG tablet Take 1,000 mg by mouth See admin instructions. Takes daily for 3-5 days for outbreaks  0   . venlafaxine XR (EFFEXOR-XR) 75 MG 24 hr capsule Take 75 mg by mouth daily with breakfast.     . vitamin B-12 (CYANOCOBALAMIN) 100 MCG tablet Take 100 mcg by mouth daily.       Review of Systems  Constitutional: Negative for chills, diaphoresis, fatigue and fever.  Eyes: Negative for visual disturbance.  Respiratory: Negative for shortness of breath.   Cardiovascular: Negative for chest pain.  Gastrointestinal: Negative for abdominal pain, constipation, diarrhea, nausea and vomiting.  Genitourinary: Positive for vaginal discharge. Negative for dysuria, flank pain, frequency, pelvic pain, urgency and vaginal bleeding.  Neurological: Negative for dizziness, weakness, light-headedness and headaches.   Physical Exam   Blood pressure (!) 151/89, pulse (!) 105, temperature 98.9 F (37.2 C), temperature source Oral, resp. rate 20, height 5\' 7"  (1.702 m), weight 109.2 kg, SpO2 98 %.  Patient Vitals for the past 24 hrs:  BP Temp Temp src Pulse Resp SpO2 Height Weight  07/29/19 2216 (!) 151/89 -- -- (!) 105 -- -- -- --  07/29/19 2201 (!) 165/96 -- -- 97 -- -- -- --  07/29/19 2146 (!) 158/91 -- -- 92 -- -- -- --  07/29/19 2131 (!) 156/97 -- -- 91 -- -- -- --  07/29/19 2116 (!) 160/78 -- -- 97 -- -- -- --  07/29/19 2101 (!) 151/94 -- -- 96 -- -- -- --  07/29/19 2046 (!) 148/98 -- -- 98 -- -- -- --  07/29/19 2031 (!) 151/89 -- -- 96 -- -- -- --  07/29/19 2016 (!) 158/97 -- -- (!) 107 -- -- -- --  07/29/19 2003 (!) 152/99 -- -- (!) 110 -- -- -- --  07/29/19 1944 134/90 98.9 F (37.2 C) Oral (!) 111 20 -- 5\' 7"  (1.702 m) 109.2 kg  07/29/19 1906 -- -- -- (!) 117 -- -- -- --  07/29/19 1818 (!)  148/107 98.7 F (37.1 C) Oral (!) 128 16 98 % -- --   Physical Exam Vitals and nursing note reviewed.  Constitutional:      General: She is not in acute distress.    Appearance: She is normal weight. She is not ill-appearing, toxic-appearing or diaphoretic.  HENT:  Head: Normocephalic and atraumatic.  Pulmonary:     Effort: Pulmonary effort is normal.  Neurological:     Mental Status: She is alert and oriented to person, place, and time.  Psychiatric:        Mood and Affect: Mood normal.        Behavior: Behavior normal.        Thought Content: Thought content normal.        Judgment: Judgment normal.    Results for orders placed or performed during the hospital encounter of 07/29/19 (from the past 24 hour(s))  POC Urine Pregnancy, ED (not at [facility])     Status: Abnormal   Collection Time: 07/29/19  6:51 PM  Result Value Ref Range   Preg Test, Ur POSITIVE (A) NEGATIVE  Protein / creatinine ratio, urine     Status: None   Collection Time: 07/29/19  8:36 PM  Result Value Ref Range   Creatinine, Urine 150.16 mg/dL   Total Protein, Urine 16 mg/dL   Protein Creatinine Ratio 0.11 0.00 - 0.15 mg/mg[Cre]  CBC     Status: Abnormal   Collection Time: 07/29/19  8:57 PM  Result Value Ref Range   WBC 14.2 (H) 4.0 - 10.5 K/uL   RBC 4.92 3.87 - 5.11 MIL/uL   Hemoglobin 13.5 12.0 - 15.0 g/dL   HCT 73.5 36 - 46 %   MCV 83.1 80.0 - 100.0 fL   MCH 27.4 26.0 - 34.0 pg   MCHC 33.0 30.0 - 36.0 g/dL   RDW 32.9 92.4 - 26.8 %   Platelets 285 150 - 400 K/uL   nRBC 0.0 0.0 - 0.2 %  Comprehensive metabolic panel     Status: Abnormal   Collection Time: 07/29/19  8:57 PM  Result Value Ref Range   Sodium 138 135 - 145 mmol/L   Potassium 3.8 3.5 - 5.1 mmol/L   Chloride 106 98 - 111 mmol/L   CO2 22 22 - 32 mmol/L   Glucose, Bld 90 70 - 99 mg/dL   BUN <5 (L) 6 - 20 mg/dL   Creatinine, Ser 3.41 0.44 - 1.00 mg/dL   Calcium 9.7 8.9 - 96.2 mg/dL   Total Protein 6.2 (L) 6.5 - 8.1 g/dL   Albumin 2.6  (L) 3.5 - 5.0 g/dL   AST 18 15 - 41 U/L   ALT 14 0 - 44 U/L   Alkaline Phosphatase 103 38 - 126 U/L   Total Bilirubin 0.1 (L) 0.3 - 1.2 mg/dL   GFR calc non Af Amer >60 >60 mL/min   GFR calc Af Amer >60 >60 mL/min   Anion gap 10 5 - 15  ABO/Rh     Status: None   Collection Time: 07/29/19  8:57 PM  Result Value Ref Range   ABO/RH(D)      O POS Performed at [facility], [address] [place], [place] [address]   Amnisure rupture of membrane (rom)not at [facility]     Status: None   Collection Time: 07/29/19  9:08 PM  Result Value Ref Range   Amnisure ROM NEGATIVE     MAU Course  Procedures  MDM -preeclampsia evaluation without severe range BP in MAU on admission -symptoms include: none -CBC: H/H 13.5/40.9, platelets 285 -CMP: serum creatinine 0.70, AST/ALT 18/14 -PCr: 0.11 -EFM: reactive       -baseline: 135       -variability: moderate       -accels: present, 15x15       -  decels: absent       -TOCO: quiet -AmniSure: negative -consulted with Dr. Despina Hidden after second severe range pressure, will start hydralazine protocol and recommends admission for blood pressure management -called Dr. Rana Snare who agrees with plan for admission -admit to [facility]  Orders Placed This Encounter  Procedures  . CBC    Standing Status:   Standing    Number of Occurrences:   1  . Comprehensive metabolic panel    Standing Status:   Standing    Number of Occurrences:   1  . Protein / creatinine ratio, urine    Standing Status:   Standing    Number of Occurrences:   1  . Amnisure rupture of membrane (rom)not at [facility]    Standing Status:   Standing    Number of Occurrences:   1  . Notify Physician    Confirmatory reading of BP> 160/110 15 minutes later    Standing Status:   Standing    Number of Occurrences:   1    Order Specific Question:   Notify Physician    Answer:   Temp greater than or equal to 100.4    Order Specific Question:   Notify Physician    Answer:   RR greater  than 24 or less than 10    Order Specific Question:   Notify Physician    Answer:   HR greater than 120 or less than 50    Order Specific Question:   Notify Physician    Answer:   SBP greater than 160 mmHG or less than 80 mmHG    Order Specific Question:   Notify Physician    Answer:   DBP greater than 110 mmHG or less than 45 mmHG    Order Specific Question:   Notify Physician    Answer:   Urinary output is less than for any 4 hour period  . Measure blood pressure    10 minutes after giving labetalol 40 MG IV dose.  Call MD if SBP >/= 160 or DBP >/= 110.    Standing Status:   Standing    Number of Occurrences:   1  . POC Urine Pregnancy, ED (not at [facility])    Standing Status:   Standing    Number of Occurrences:   1  . ABO/Rh    Standing Status:   Standing    Number of Occurrences:   1   Meds ordered this encounter  Medications  . AND Linked Order Group   . hydrALAZINE (APRESOLINE) injection 5 mg   . hydrALAZINE (APRESOLINE) injection 10 mg   . labetalol (NORMODYNE) injection 20 mg   . labetalol (NORMODYNE) injection 40 mg  . NIFEdipine (PROCARDIA-XL/NIFEDICAL-XL) 24 hr tablet 60 mg    Assessment and Plan   1. Chronic hypertension affecting pregnancy   2. [redacted] weeks gestation of pregnancy   3. NST (non-stress test) reactive    -admit to [facility]  Odie Sera Jalal Rauch 07/29/2019, 10:31 PM

## 2019-07-30 ENCOUNTER — Encounter (HOSPITAL_COMMUNITY): Payer: Self-pay | Admitting: Obstetrics and Gynecology

## 2019-07-30 MED ORDER — NIFEDIPINE ER 60 MG PO TB24: 60.0000 mg | ORAL_TABLET | Freq: Every day | ORAL | 3 refills | Status: AC

## 2019-07-30 MED ORDER — BETAMETHASONE SOD PHOS & ACET 6 (3-3) MG/ML IJ SUSP
12.0000 mg | Freq: Once | INTRAMUSCULAR | Status: AC
  Administered 2019-07-30: 12 mg via INTRAMUSCULAR

## 2019-07-30 NOTE — H&P (Signed)
Dawn Friedman is a 39 y.o. female presenting for  Dawn Friedman is a 39 y.o. G3P1011 at 1256w1d who presents to MAU for preeclampsia evaluation. Patient denies issues with blood pressure, but does report it was elevated at the beginning of pregnancy and believes she was given a diagnosis of cHTN. Patient reports she is currently taking Procardia, which she started yesterday. Patient reports she tried labetalol, but reports that it made her nauseous and generally gave her the feeling of being unwell. Patient reports she just started taking BP medication a little over a week ago.  Patient reports she has a headache today. Patient thought it was because she didn't eat, but after she ate the headache worsened. Patient now reports the headache is intermittent and rates as 0/10.  Pt denies blurry vision/seeing spots, N/V, epigastric pain, swelling in face and hands, sudden weight gain. Pt denies chest pain and SOB.  Pt denies constipation, diarrhea, or urinary problems. Pt denies fever, chills, fatigue, sweating or changes in appetite. Pt denies dizziness, light-headedness, weakness.  Pt denies VB, ctx, and reports good FM.  Patient also reports possible ROM. Patient reports she was driving in her car and when she got home she noticed that her underwear and clothes were soaked. Patient denies any LOF since that time. Patient reports a clear, odorless fluid. Patient denies wearing a pad.  Current pregnancy problems? cHTN Blood Type? none Allergies? AMOX, latex Current medications? Sertraline, Procardia, bASA, PNVs, folic acid, Unisom Current PNC & next appt? [facility], Friday 08/05/2019           OB History    Gravida  3   Para  1   Term  1   Preterm      AB  1   Living  1     SAB      TAB  1   Ectopic      Multiple      Live Births  1               Past Medical History:  Diagnosis Date  . Hematuria   . HSV infection   . PCOS  (polycystic ovarian syndrome)   . Sickle cell trait [facility](HCC)          Past Surgical History:  Procedure Laterality Date  . FINGER SURGERY    . INDUCED ABORTION           Family History  Problem Relation Age of Onset  . Cancer Mother   . Cancer Father   . Heart disease Father   . COPD Father   . Stroke Father   . Stroke Brother     Social History        Tobacco Use  . Smoking status: Former Games developermoker  . Smokeless tobacco: Never Used  Substance Use Topics  . Alcohol use: Not Currently    Comment: social  . Drug use: No    Allergies:       Allergies  Allergen Reactions  . Amoxicillin Rash    Has patient had a PCN reaction causing immediate rash, facial/tongue/throat swelling, SOB or lightheadedness with hypotension: No Has patient had a PCN reaction causing severe rash involving mucus membranes or skin necrosis: No Has patient had a PCN reaction that required hospitalization No Has patient had a PCN reaction occurring within the last 10 years: No If all of the above answers are "NO", then may proceed with Cephalosporin use.   . Latex  Other (See Comments)    Irritation and dries her skin out           Medications Prior to Admission  Medication Sig Dispense Refill Last Dose  . acetaminophen (TYLENOL) 325 MG tablet Take 650 mg by mouth every 6 (six) hours as needed.   07/28/2019 at Unknown time  . aspirin EC 81 MG tablet Take 81 mg by mouth daily. Swallow whole.     . folic acid (FOLVITE) 1 MG tablet Take 1 mg by mouth daily.     Marland Kitchen NIFEdipine (ADALAT CC) 30 MG 24 hr tablet Take 30 mg by mouth daily.     . Prenatal Vit-Fe Fumarate-FA (PRENATAL MULTIVITAMIN) TABS tablet Take 1 tablet by mouth daily at 12 noon.     . sertraline (ZOLOFT) 25 MG tablet Take 25 mg by mouth daily.     Marland Kitchen ibuprofen (ADVIL,MOTRIN) 800 MG tablet Take 800 mg by mouth every 8 (eight) hours as needed. Pain  0   . pantoprazole (PROTONIX) 20 MG tablet Take  20 mg by mouth daily.     . polyethylene glycol (MIRALAX / GLYCOLAX) packet Take 17 g by mouth daily.     . valACYclovir (VALTREX) 500 MG tablet Take 1,000 mg by mouth See admin instructions. Takes daily for 3-5 days for outbreaks  0   . venlafaxine XR (EFFEXOR-XR) 75 MG 24 hr capsule Take 75 mg by mouth daily with breakfast.     . vitamin B-12 (CYANOCOBALAMIN) 100 MCG tablet Take 100 mcg by mouth daily.       Review of Systems  Constitutional: Negative for chills, diaphoresis, fatigue and fever.  Eyes: Negative for visual disturbance.  Respiratory: Negative for shortness of breath.   Cardiovascular: Negative for chest pain.  Gastrointestinal: Negative for abdominal pain, constipation, diarrhea, nausea and vomiting.  Genitourinary: Positive for vaginal discharge. Negative for dysuria, flank pain, frequency, pelvic pain, urgency and vaginal bleeding.  Neurological: Negative for dizziness, weakness, light-headedness and headaches.   Physical Exam   Blood pressure (!) 151/89, pulse (!) 105, temperature 98.9 F (37.2 C), temperature source Oral, resp. rate 20, height 5\' 7"  (1.702 m), weight 109.2 kg, SpO2 98 %.  Patient Vitals for the past 24 hrs:  BP Temp Temp src Pulse Resp SpO2 Height Weight  07/29/19 2216 (!) 151/89 -- -- (!) 105 -- -- -- --  07/29/19 2201 (!) 165/96 -- -- 97 -- -- -- --  07/29/19 2146 (!) 158/91 -- -- 92 -- -- -- --  07/29/19 2131 (!) 156/97 -- -- 91 -- -- -- --  07/29/19 2116 (!) 160/78 -- -- 97 -- -- -- --  07/29/19 2101 (!) 151/94 -- -- 96 -- -- -- --  07/29/19 2046 (!) 148/98 -- -- 98 -- -- -- --  07/29/19 2031 (!) 151/89 -- -- 96 -- -- -- --  07/29/19 2016 (!) 158/97 -- -- (!) 107 -- -- -- --  07/29/19 2003 (!) 152/99 -- -- (!) 110 -- -- -- --  07/29/19 1944 134/90 98.9 F (37.2 C) Oral (!) 111 20 -- 5\' 7"  (1.702 m) 109.2 kg  07/29/19 1906 -- -- -- (!) 117 -- -- -- --  07/29/19 1818 (!) 148/107 98.7 F (37.1 C) Oral (!) 128 16 98 % -- --    Physical Exam Vitals and nursing note reviewed.  Constitutional:      General: She is not in acute distress.    Appearance: She is normal weight. She is not ill-appearing,  toxic-appearing or diaphoretic.  HENT:     Head: Normocephalic and atraumatic.  Pulmonary:     Effort: Pulmonary effort is normal.  Neurological:     Mental Status: She is alert and oriented to person, place, and time.  Psychiatric:        Mood and Affect: Mood normal.        Behavior: Behavior normal.        Thought Content: Thought content normal.        Judgment: Judgment normal.    Lab Results Last 24 Hours        Results for orders placed or performed during the hospital encounter of 07/29/19 (from the past 24 hour(s))  POC Urine Pregnancy, ED (not at [facility])     Status: Abnormal   Collection Time: 07/29/19  6:51 PM  Result Value Ref Range   Preg Test, Ur POSITIVE (A) NEGATIVE  Protein / creatinine ratio, urine     Status: None   Collection Time: 07/29/19  8:36 PM  Result Value Ref Range   Creatinine, Urine 150.16 mg/dL   Total Protein, Urine 16 mg/dL   Protein Creatinine Ratio 0.11 0.00 - 0.15 mg/mg[Cre]  CBC     Status: Abnormal   Collection Time: 07/29/19  8:57 PM  Result Value Ref Range   WBC 14.2 (H) 4.0 - 10.5 K/uL   RBC 4.92 3.87 - 5.11 MIL/uL   Hemoglobin 13.5 12.0 - 15.0 g/dL   HCT 03.5 36 - 46 %   MCV 83.1 80.0 - 100.0 fL   MCH 27.4 26.0 - 34.0 pg   MCHC 33.0 30.0 - 36.0 g/dL   RDW 46.5 68.1 - 27.5 %   Platelets 285 150 - 400 K/uL   nRBC 0.0 0.0 - 0.2 %  Comprehensive metabolic panel     Status: Abnormal   Collection Time: 07/29/19  8:57 PM  Result Value Ref Range   Sodium 138 135 - 145 mmol/L   Potassium 3.8 3.5 - 5.1 mmol/L   Chloride 106 98 - 111 mmol/L   CO2 22 22 - 32 mmol/L   Glucose, Bld 90 70 - 99 mg/dL   BUN <5 (L) 6 - 20 mg/dL   Creatinine, Ser 1.70 0.44 - 1.00 mg/dL   Calcium 9.7 8.9 - 01.7 mg/dL   Total Protein 6.2 (L) 6.5 - 8.1 g/dL    Albumin 2.6 (L) 3.5 - 5.0 g/dL   AST 18 15 - 41 U/L   ALT 14 0 - 44 U/L   Alkaline Phosphatase 103 38 - 126 U/L   Total Bilirubin 0.1 (L) 0.3 - 1.2 mg/dL   GFR calc non Af Amer >60 >60 mL/min   GFR calc Af Amer >60 >60 mL/min   Anion gap 10 5 - 15  ABO/Rh     Status: None   Collection Time: 07/29/19  8:57 PM  Result Value Ref Range   ABO/RH(D)      O POS Performed at [facility], [address], [place], [place] [address]   Amnisure rupture of membrane (rom)not at [facility]     Status: None   Collection Time: 07/29/19  9:08 PM  Result Value Ref Range   Amnisure ROM NEGATIVE       MAU Course  Procedures  MDM -preeclampsia evaluation without severe range BP in MAU on admission -symptoms include: none -CBC: H/H 13.5/40.9, platelets 285 -CMP: serum creatinine 0.70, AST/ALT 18/14 -PCr: 0.11 -EFM: reactive       -  baseline: 135       -variability: moderate       -accels: present, 15x15       -decels: absent       -TOCO: quiet -AmniSure: negative -consulted with Dr. Despina Hidden after second severe range pressure, will start hydralazine protocol and recommends admission for blood pressure management -called Dr. Rana Snare who agrees with plan for admission -admit to [facility]    . OB History    Gravida  3   Para  1   Term  1   Preterm      AB  1   Living  1     SAB      TAB  1   Ectopic      Multiple      Live Births  1          Past Medical History:  Diagnosis Date  . Hematuria   . HSV infection   . PCOS (polycystic ovarian syndrome)   . Sickle cell trait [facility])    Past Surgical History:  Procedure Laterality Date  . FINGER SURGERY    . INDUCED ABORTION     Family History: family history includes COPD in her father; Cancer in her father and mother; Heart disease in her father; Stroke in her brother and father. Social History: Prenatal labs: ABO, Rh: --/--/O POS (07/16 2250) Antibody: NEG (07/16 2250) Rubella:   RPR:     HBsAg:    HIV:    GBS:     Assessment/Plan: Admitted for BP management with PIH sxs as discused above   Turner Daniels 07/30/2019, 9:49 AM

## 2019-07-30 NOTE — Discharge Summary (Signed)
Physician Discharge Summary  Patient ID: Dawn Friedman MRN: [id number] DOB/AGE: Jun 09, 1980 39 y.o.  Admit date: 07/29/2019 Discharge date: 07/30/2019  Admission Diagnoses:Uncontrolled HTN in pregnancy  Discharge Diagnoses: same Active Problems:   Gestational hypertension   Discharged Condition: good  Hospital Course: Pt admitted due to severe range BPs with Hx of CHTN/Gestational HTN.  Received hydralizine in MAU for control and started on oral nifedinpine 60mg  ER.  Her BP remained in good control since beginning the oral meds, Fetal surveillance was normal, and she had no signs of preeclampsia.  She also received BMZ series  Consults: None  Significant Diagnostic Studies: labs normal  Treatments: BMX, and anti HTN meds  Discharge Exam: Blood pressure 133/75, pulse (!) 102, temperature 97.9 F (36.6 C), temperature source Oral, resp. rate 18, height 5\' 7"  (1.702 m), weight 109.2 kg, SpO2 98 %. Abd Gravid Nt DTRs 1/4  Disposition: Discharge disposition: 01-Home or Self Care       Discharge Instructions    Call MD for:  difficulty breathing, headache or visual disturbances   Complete by: As directed    Call MD for:  persistant nausea and vomiting   Complete by: As directed    Call MD for:  redness, tenderness, or signs of infection (pain, swelling, redness, odor or green/yellow discharge around incision site)   Complete by: As directed    Call MD for:  severe uncontrolled pain   Complete by: As directed    Call MD for:  temperature >100.4   Complete by: As directed    Diet general   Complete by: As directed    Driving Restrictions   Complete by: As directed    No driving for 2 weeks   Increase activity slowly   Complete by: As directed    Lifting restrictions   Complete by: As directed    No lifting anything greater than 10 pounds (if you have to ask, don't lift it)   Sexual Activity Restrictions   Complete by: As directed    Nothing in the vagina for 6  weeks     Allergies as of 07/30/2019      Reactions   Amoxicillin Rash   Has patient had a PCN reaction causing immediate rash, facial/tongue/throat swelling, SOB or lightheadedness with hypotension: No Has patient had a PCN reaction causing severe rash involving mucus membranes or skin necrosis: No Has patient had a PCN reaction that required hospitalization No Has patient had a PCN reaction occurring within the last 10 years: No If all of the above answers are "NO", then may proceed with Cephalosporin use.   Latex Other (See Comments)   Irritation and dries her skin out      Medication List    STOP taking these medications   ibuprofen 800 MG tablet Commonly known as: ADVIL   pantoprazole 20 MG tablet Commonly known as: PROTONIX   venlafaxine XR 75 MG 24 hr capsule Commonly known as: EFFEXOR-XR     TAKE these medications   acetaminophen 325 MG tablet Commonly known as: TYLENOL Take 650 mg by mouth every 6 (six) hours as needed.   aspirin EC 81 MG tablet Take 81 mg by mouth daily. Swallow whole.   folic acid 1 MG tablet Commonly known as: FOLVITE Take 1 mg by mouth daily.   NIFEdipine 60 MG 24 hr tablet Commonly known as: ADALAT CC Take 1 tablet (60 mg total) by mouth daily. What changed:   medication strength  how much to  take   polyethylene glycol 17 g packet Commonly known as: MIRALAX / GLYCOLAX Take 17 g by mouth daily.   prenatal multivitamin Tabs tablet Take 1 tablet by mouth daily at 12 noon.   sertraline 25 MG tablet Commonly known as: ZOLOFT Take 25 mg by mouth daily.   valACYclovir 500 MG tablet Commonly known as: VALTREX Take 1,000 mg by mouth See admin instructions. Takes daily for 3-5 days for outbreaks   vitamin B-12 100 MCG tablet Commonly known as: CYANOCOBALAMIN Take 100 mcg by mouth daily.        Signed: Turner Daniels 07/30/2019, 9:56 AM

## 2019-08-10 LAB — OB RESULTS CONSOLE GBS: GBS: NEGATIVE

## 2019-08-14 DIAGNOSIS — Z419 Encounter for procedure for purposes other than remedying health state, unspecified: Secondary | ICD-10-CM | POA: Diagnosis not present

## 2019-08-23 DIAGNOSIS — Z3A37 37 weeks gestation of pregnancy: Secondary | ICD-10-CM | POA: Diagnosis not present

## 2019-08-23 DIAGNOSIS — O99891 Other specified diseases and conditions complicating pregnancy: Secondary | ICD-10-CM | POA: Diagnosis not present

## 2019-08-23 DIAGNOSIS — Z3400 Encounter for supervision of normal first pregnancy, unspecified trimester: Secondary | ICD-10-CM | POA: Diagnosis not present

## 2019-08-23 DIAGNOSIS — N390 Urinary tract infection, site not specified: Secondary | ICD-10-CM | POA: Diagnosis not present

## 2019-08-25 ENCOUNTER — Other Ambulatory Visit (HOSPITAL_COMMUNITY)
Admission: RE | Admit: 2019-08-25 | Discharge: 2019-08-25 | Disposition: A | Discharge status: Home / Self Care | Payer: 59 | Source: Ambulatory Visit | Attending: Obstetrics and Gynecology | Admitting: Obstetrics and Gynecology

## 2019-08-25 DIAGNOSIS — Z01812 Encounter for preprocedural laboratory examination: Secondary | ICD-10-CM | POA: Insufficient documentation

## 2019-08-25 DIAGNOSIS — Z20822 Contact with and (suspected) exposure to covid-19: Secondary | ICD-10-CM | POA: Insufficient documentation

## 2019-08-25 LAB — SARS CORONAVIRUS 2 (TAT 6-24 HRS): SARS Coronavirus 2: NEGATIVE

## 2019-08-26 ENCOUNTER — Inpatient Hospital Stay (HOSPITAL_COMMUNITY)
Admission: AD | Admit: 2019-08-26 | Discharge: 2019-08-28 | DRG: 807 | Disposition: A | Discharge status: Home / Self Care | Payer: 59 | Attending: Obstetrics and Gynecology | Admitting: Obstetrics and Gynecology

## 2019-08-26 ENCOUNTER — Inpatient Hospital Stay (HOSPITAL_COMMUNITY): Payer: 59 | Admitting: Anesthesiology

## 2019-08-26 ENCOUNTER — Inpatient Hospital Stay (HOSPITAL_COMMUNITY): Payer: 59

## 2019-08-26 ENCOUNTER — Encounter (HOSPITAL_COMMUNITY): Payer: Self-pay | Admitting: Obstetrics and Gynecology

## 2019-08-26 ENCOUNTER — Other Ambulatory Visit: Payer: Self-pay

## 2019-08-26 DIAGNOSIS — E669 Obesity, unspecified: Secondary | ICD-10-CM | POA: Diagnosis not present

## 2019-08-26 DIAGNOSIS — O99214 Obesity complicating childbirth: Secondary | ICD-10-CM | POA: Diagnosis not present

## 2019-08-26 DIAGNOSIS — O1002 Pre-existing essential hypertension complicating childbirth: Principal | ICD-10-CM | POA: Diagnosis present

## 2019-08-26 DIAGNOSIS — Z3A38 38 weeks gestation of pregnancy: Secondary | ICD-10-CM

## 2019-08-26 DIAGNOSIS — Z880 Allergy status to penicillin: Secondary | ICD-10-CM

## 2019-08-26 DIAGNOSIS — Z3490 Encounter for supervision of normal pregnancy, unspecified, unspecified trimester: Secondary | ICD-10-CM | POA: Diagnosis present

## 2019-08-26 DIAGNOSIS — Z87891 Personal history of nicotine dependence: Secondary | ICD-10-CM

## 2019-08-26 DIAGNOSIS — Z20822 Contact with and (suspected) exposure to covid-19: Secondary | ICD-10-CM | POA: Diagnosis not present

## 2019-08-26 HISTORY — DX: Essential (primary) hypertension: I10

## 2019-08-26 HISTORY — DX: Anxiety disorder, unspecified: F41.9

## 2019-08-26 LAB — CBC
HCT: 39.8 % (ref 36.0–46.0)
HCT: 42.1 % (ref 36.0–46.0)
Hemoglobin: 13.1 g/dL (ref 12.0–15.0)
Hemoglobin: 13.8 g/dL (ref 12.0–15.0)
MCH: 27.3 pg (ref 26.0–34.0)
MCH: 27.1 pg (ref 26.0–34.0)
MCHC: 32.9 g/dL (ref 30.0–36.0)
MCHC: 32.8 g/dL (ref 30.0–36.0)
MCV: 83.1 fL (ref 80.0–100.0)
MCV: 82.7 fL (ref 80.0–100.0)
Platelets: 301 10*3/uL (ref 150–400)
Platelets: 286 10*3/uL (ref 150–400)
RBC: 4.79 MIL/uL (ref 3.87–5.11)
RBC: 5.09 MIL/uL (ref 3.87–5.11)
RDW: 14.6 % (ref 11.5–15.5)
RDW: 14.6 % (ref 11.5–15.5)
WBC: 15.2 10*3/uL — ABNORMAL HIGH (ref 4.0–10.5)
WBC: 16.7 10*3/uL — ABNORMAL HIGH (ref 4.0–10.5)
nRBC: 0 % (ref 0.0–0.2)
nRBC: 0 % (ref 0.0–0.2)

## 2019-08-26 LAB — TYPE AND SCREEN
ABO/RH(D): O POS
Antibody Screen: NEGATIVE

## 2019-08-26 LAB — RPR: RPR Ser Ql: NONREACTIVE

## 2019-08-26 LAB — COMPREHENSIVE METABOLIC PANEL
ALT: 16 U/L (ref 0–44)
AST: 22 U/L (ref 15–41)
Albumin: 2.7 g/dL — ABNORMAL LOW (ref 3.5–5.0)
Alkaline Phosphatase: 133 U/L — ABNORMAL HIGH (ref 38–126)
Anion gap: 9 (ref 5–15)
BUN: 6 mg/dL (ref 6–20)
CO2: 21 mmol/L — ABNORMAL LOW (ref 22–32)
Calcium: 9.4 mg/dL (ref 8.9–10.3)
Chloride: 105 mmol/L (ref 98–111)
Creatinine, Ser: 0.67 mg/dL (ref 0.44–1.00)
GFR calc Af Amer: >60 mL/min (ref >60)
GFR calc non Af Amer: >60 mL/min (ref >60)
Glucose, Bld: 114 mg/dL — ABNORMAL HIGH (ref 70–99)
Potassium: 3.8 mmol/L (ref 3.5–5.1)
Sodium: 135 mmol/L (ref 135–145)
Total Bilirubin: 0.2 mg/dL — ABNORMAL LOW (ref 0.3–1.2)
Total Protein: 6.6 g/dL (ref 6.5–8.1)

## 2019-08-26 MED ORDER — BENZOCAINE-MENTHOL 20-0.5 % EX AERO
1.0000 "application " | INHALATION_SPRAY | CUTANEOUS | Status: DC | PRN
  Administered 2019-08-26: 1 via TOPICAL
  Filled 2019-08-26: qty 56

## 2019-08-26 MED ORDER — LIDOCAINE HCL (PF) 1 % IJ SOLN: 30.0000 mL | INTRAMUSCULAR | Status: DC | PRN

## 2019-08-26 MED ORDER — LIDOCAINE HCL (PF) 1 % IJ SOLN
INTRAMUSCULAR | Status: DC | PRN
  Administered 2019-08-26: 4 mL via EPIDURAL
  Administered 2019-08-26: 5 mL via EPIDURAL

## 2019-08-26 MED ORDER — ZOLPIDEM TARTRATE 5 MG PO TABS: 5.0000 mg | ORAL_TABLET | Freq: Every evening | ORAL | Status: DC | PRN

## 2019-08-26 MED ORDER — EPHEDRINE 5 MG/ML INJ: 10.0000 mg | INTRAVENOUS | Status: DC | PRN

## 2019-08-26 MED ORDER — OXYTOCIN-SODIUM CHLORIDE 30-0.9 UT/500ML-% IV SOLN
2.5000 [IU]/h | INTRAVENOUS | Status: DC
  Filled 2019-08-26: qty 500

## 2019-08-26 MED ORDER — PHENYLEPHRINE 40 MCG/ML (10ML) SYRINGE FOR IV PUSH (FOR BLOOD PRESSURE SUPPORT): 80.0000 ug | PREFILLED_SYRINGE | INTRAVENOUS | Status: DC | PRN

## 2019-08-26 MED ORDER — ONDANSETRON HCL 4 MG/2ML IJ SOLN: 4.0000 mg | INTRAMUSCULAR | Status: DC | PRN

## 2019-08-26 MED ORDER — LACTATED RINGERS IV SOLN: INTRAVENOUS | Status: DC

## 2019-08-26 MED ORDER — ACETAMINOPHEN 325 MG PO TABS
650.0000 mg | ORAL_TABLET | ORAL | Status: DC | PRN
  Administered 2019-08-26 – 2019-08-27 (×2): 650 mg via ORAL
  Filled 2019-08-26 (×2): qty 2

## 2019-08-26 MED ORDER — NIFEDIPINE ER OSMOTIC RELEASE 30 MG PO TB24
60.0000 mg | ORAL_TABLET | Freq: Every day | ORAL | Status: DC
  Administered 2019-08-26 – 2019-08-28 (×3): 60 mg via ORAL
  Filled 2019-08-26 (×3): qty 2

## 2019-08-26 MED ORDER — WITCH HAZEL-GLYCERIN EX PADS: 1.0000 "application " | MEDICATED_PAD | CUTANEOUS | Status: DC | PRN

## 2019-08-26 MED ORDER — IBUPROFEN 600 MG PO TABS
600.0000 mg | ORAL_TABLET | Freq: Four times a day (QID) | ORAL | Status: DC
  Administered 2019-08-26 – 2019-08-28 (×7): 600 mg via ORAL
  Filled 2019-08-26 (×8): qty 1

## 2019-08-26 MED ORDER — MISOPROSTOL 25 MCG QUARTER TABLET
25.0000 ug | ORAL_TABLET | ORAL | Status: DC | PRN
  Administered 2019-08-26 (×2): 25 ug via VAGINAL
  Filled 2019-08-26 (×2): qty 1

## 2019-08-26 MED ORDER — BUTORPHANOL TARTRATE 1 MG/ML IJ SOLN
1.0000 mg | INTRAMUSCULAR | Status: DC | PRN
  Administered 2019-08-26 (×3): 1 mg via INTRAVENOUS
  Filled 2019-08-26 (×3): qty 1

## 2019-08-26 MED ORDER — TETANUS-DIPHTH-ACELL PERTUSSIS 5-2.5-18.5 LF-MCG/0.5 IM SUSP: 0.5000 mL | Freq: Once | INTRAMUSCULAR | Status: DC

## 2019-08-26 MED ORDER — ONDANSETRON HCL 4 MG PO TABS: 4.0000 mg | ORAL_TABLET | ORAL | Status: DC | PRN

## 2019-08-26 MED ORDER — SIMETHICONE 80 MG PO CHEW: 80.0000 mg | CHEWABLE_TABLET | ORAL | Status: DC | PRN

## 2019-08-26 MED ORDER — DIPHENHYDRAMINE HCL 50 MG/ML IJ SOLN: 12.5000 mg | INTRAMUSCULAR | Status: DC | PRN

## 2019-08-26 MED ORDER — DIPHENHYDRAMINE HCL 25 MG PO CAPS: 25.0000 mg | ORAL_CAPSULE | Freq: Four times a day (QID) | ORAL | Status: DC | PRN

## 2019-08-26 MED ORDER — TERBUTALINE SULFATE 1 MG/ML IJ SOLN: 0.2500 mg | Freq: Once | INTRAMUSCULAR | Status: DC | PRN

## 2019-08-26 MED ORDER — FENTANYL-BUPIVACAINE-NACL 0.5-0.125-0.9 MG/250ML-% EP SOLN
12.0000 mL/h | EPIDURAL | Status: DC | PRN
  Filled 2019-08-26: qty 250

## 2019-08-26 MED ORDER — SODIUM CHLORIDE (PF) 0.9 % IJ SOLN
INTRAMUSCULAR | Status: DC | PRN
  Administered 2019-08-26: 12 mL/h via EPIDURAL

## 2019-08-26 MED ORDER — ONDANSETRON HCL 4 MG/2ML IJ SOLN: 4.0000 mg | Freq: Four times a day (QID) | INTRAMUSCULAR | Status: DC | PRN

## 2019-08-26 MED ORDER — OXYCODONE-ACETAMINOPHEN 5-325 MG PO TABS: 2.0000 | ORAL_TABLET | ORAL | Status: DC | PRN

## 2019-08-26 MED ORDER — ACETAMINOPHEN 325 MG PO TABS: 650.0000 mg | ORAL_TABLET | ORAL | Status: DC | PRN

## 2019-08-26 MED ORDER — LACTATED RINGERS IV SOLN
500.0000 mL | Freq: Once | INTRAVENOUS | Status: AC
  Administered 2019-08-26: 500 mL via INTRAVENOUS

## 2019-08-26 MED ORDER — OXYCODONE-ACETAMINOPHEN 5-325 MG PO TABS: 1.0000 | ORAL_TABLET | ORAL | Status: DC | PRN

## 2019-08-26 MED ORDER — SERTRALINE HCL 25 MG PO TABS
25.0000 mg | ORAL_TABLET | Freq: Every day | ORAL | Status: DC
  Administered 2019-08-27 – 2019-08-28 (×2): 25 mg via ORAL
  Filled 2019-08-26 (×2): qty 1

## 2019-08-26 MED ORDER — SENNOSIDES-DOCUSATE SODIUM 8.6-50 MG PO TABS
2.0000 | ORAL_TABLET | ORAL | Status: DC
  Administered 2019-08-26 – 2019-08-27 (×2): 2 via ORAL
  Filled 2019-08-26 (×2): qty 2

## 2019-08-26 MED ORDER — SOD CITRATE-CITRIC ACID 500-334 MG/5ML PO SOLN: 30.0000 mL | ORAL | Status: DC | PRN

## 2019-08-26 MED ORDER — PRENATAL MULTIVITAMIN CH
1.0000 | ORAL_TABLET | Freq: Every day | ORAL | Status: DC
  Administered 2019-08-27 – 2019-08-28 (×2): 1 via ORAL
  Filled 2019-08-26 (×2): qty 1

## 2019-08-26 MED ORDER — MEDROXYPROGESTERONE ACETATE 150 MG/ML IM SUSP: 150.0000 mg | INTRAMUSCULAR | Status: DC | PRN

## 2019-08-26 MED ORDER — OXYTOCIN BOLUS FROM INFUSION
333.0000 mL | Freq: Once | INTRAVENOUS | Status: AC
  Administered 2019-08-26: 333 mL via INTRAVENOUS

## 2019-08-26 MED ORDER — LACTATED RINGERS IV SOLN: 500.0000 mL | INTRAVENOUS | Status: DC | PRN

## 2019-08-26 MED ORDER — DIBUCAINE (PERIANAL) 1 % EX OINT: 1.0000 "application " | TOPICAL_OINTMENT | CUTANEOUS | Status: DC | PRN

## 2019-08-26 MED ORDER — COCONUT OIL OIL
1.0000 "application " | TOPICAL_OIL | Status: DC | PRN
  Administered 2019-08-27: 1 via TOPICAL

## 2019-08-26 MED ORDER — OXYTOCIN-SODIUM CHLORIDE 30-0.9 UT/500ML-% IV SOLN
1.0000 m[IU]/min | INTRAVENOUS | Status: DC
  Administered 2019-08-26: 2 m[IU]/min via INTRAVENOUS

## 2019-08-26 MED ORDER — MEASLES, MUMPS & RUBELLA VAC IJ SOLR: 0.5000 mL | Freq: Once | INTRAMUSCULAR | Status: DC

## 2019-08-26 NOTE — Anesthesia Preprocedure Evaluation (Addendum)
Anesthesia Evaluation  Patient identified by MRN, date of birth, ID band Patient awake    Reviewed: Allergy & Precautions, NPO status , Patient's Chart, lab work & pertinent test results  History of Anesthesia Complications Negative for: history of anesthetic complications  Airway Mallampati: II   Neck ROM: Full    Dental   Pulmonary former smoker,    Pulmonary exam normal        Cardiovascular hypertension (chronic), Pt. on medications Normal cardiovascular exam     Neuro/Psych PSYCHIATRIC DISORDERS Anxiety negative neurological ROS     GI/Hepatic negative GI ROS, Neg liver ROS,   Endo/Other   Obesity   Renal/GU negative Renal ROS     Musculoskeletal negative musculoskeletal ROS (+)   Abdominal (+) + obese,   Peds  Hematology  (+) Blood dyscrasia, Sickle cell trait ,   Anesthesia Other Findings HSV Covid test negative   Reproductive/Obstetrics (+) Pregnancy  PCOS                             Anesthesia Physical Anesthesia Plan  ASA: II  Anesthesia Plan: Epidural   Post-op Pain Management:    Induction:   PONV Risk Score and Plan: 2 and Treatment may vary due to age or medical condition  Airway Management Planned: Natural Airway  Additional Equipment: None  Intra-op Plan:   Post-operative Plan:   Informed Consent: I have reviewed the patients History and Physical, chart, labs and discussed the procedure including the risks, benefits and alternatives for the proposed anesthesia with the patient or authorized representative who has indicated his/her understanding and acceptance.       Plan Discussed with: Anesthesiologist  Anesthesia Plan Comments: (Labs reviewed. Platelets acceptable, patient not taking any blood thinning medications. Per RN, FHR tracing reported to be stable enough for sitting procedure. Risks and benefits discussed with patient, including PDPH,  backache, epidural hematoma, failed epidural, blood pressure changes, allergic reaction, and nerve injury. Patient expressed understanding and wished to proceed.)       Anesthesia Quick Evaluation

## 2019-08-26 NOTE — H&P (Signed)
Dawn Friedman is a 39 y.o. female presenting for IOL for CHTN on meds.  BP in good control.  AMA with normal NIPS.  GBS- OB History    Gravida  3   Para  1   Term  1   Preterm      AB  1   Living  1     SAB      TAB  1   Ectopic      Multiple      Live Births  1          Past Medical History:  Diagnosis Date  . Anxiety   . Hematuria   . HSV infection   . Hypertension   . PCOS (polycystic ovarian syndrome)   . Sickle cell trait [facility])    Past Surgical History:  Procedure Laterality Date  . FINGER SURGERY    . INDUCED ABORTION     Family History: family history includes COPD in her father; Cancer in her father and mother; Heart disease in her father; Stroke in her brother and father. Social History:  reports that she has quit smoking. She has never used smokeless tobacco. She reports previous alcohol use. She reports that she does not use drugs.     Maternal Diabetes: No Genetic Screening: Normal Maternal Ultrasounds/Referrals: Normal Fetal Ultrasounds or other Referrals:  None Maternal Substance Abuse:  No Significant Maternal Medications:  None Significant Maternal Lab Results:  Group B Strep negative Other Comments:  None  Review of Systems History Cx 2/50/-3 AROM clear Blood pressure 138/86, pulse 86, temperature 97.8 F (36.6 C), temperature source Axillary, resp. rate 15, height 5' 7.5" (1.715 m), weight 112.5 kg. Exam Physical Exam  Prenatal labs: ABO, Rh: --/--/O POS (08/13 0041) Antibody: NEG (08/13 0041) Rubella: Immune (01/21 0000) RPR: Nonreactive (01/21 0000)  HBsAg: Negative (01/21 0000)  HIV: Non-reactive (01/21 0000)  GBS: Negative/-- (07/28 0000)   Assessment/Plan: IUP at 38 weeks  CHTN with good control on meds.  Normal labs and no sxs of PIH Cytotec, AROM, Pitocin Anticipate SVD   Turner Daniels 08/26/2019, 8:54 AM

## 2019-08-26 NOTE — Anesthesia Procedure Notes (Signed)
Epidural Patient location during procedure: OB Start time: 08/26/2019 4:41 PM End time: 08/26/2019 4:45 PM  Staffing Anesthesiologist: Beryle Lathe, MD Performed: anesthesiologist   Preanesthetic Checklist Completed: patient identified, IV checked, risks and benefits discussed, monitors and equipment checked, pre-op evaluation and timeout performed  Epidural Patient position: sitting Prep: DuraPrep Patient monitoring: continuous pulse ox and blood pressure Approach: midline Location: L2-L3 Injection technique: LOR saline  Needle:  Needle type: Tuohy  Needle gauge: 17 G Needle length: 9 cm Needle insertion depth: 9 cm Catheter size: 19 Gauge Catheter at skin depth: 14 cm Test dose: negative and Other (1% lidocaine)  Assessment Events: blood not aspirated  Additional Notes Patient identified. Risks including, but not limited to, bleeding, infection, nerve damage, paralysis, inadequate analgesia, blood pressure changes, nausea, vomiting, allergic reaction, postpartum back pain, itching, and headache were discussed. Patient expressed understanding and wished to proceed. Sterile prep and drape, including hand hygiene, mask, and sterile gloves were used. The patient was positioned and the spine was prepped. The skin was anesthetized with lidocaine. No paraesthesia or other complication noted. The patient did not experience any signs of intravascular injection such as tinnitus or metallic taste in mouth, nor signs of intrathecal spread such as rapid motor block. Please see nursing notes for vital signs. The patient tolerated the procedure well.   Leslye Peer, MDReason for block:procedure for pain

## 2019-08-27 LAB — CBC
HCT: 36.2 % (ref 36.0–46.0)
Hemoglobin: 11.9 g/dL — ABNORMAL LOW (ref 12.0–15.0)
MCH: 27.3 pg (ref 26.0–34.0)
MCHC: 32.9 g/dL (ref 30.0–36.0)
MCV: 83 fL (ref 80.0–100.0)
Platelets: 255 10*3/uL (ref 150–400)
RBC: 4.36 MIL/uL (ref 3.87–5.11)
RDW: 14.5 % (ref 11.5–15.5)
WBC: 19.2 10*3/uL — ABNORMAL HIGH (ref 4.0–10.5)
nRBC: 0 % (ref 0.0–0.2)

## 2019-08-27 NOTE — Anesthesia Postprocedure Evaluation (Signed)
Anesthesia Post Note  Patient: Dawn Friedman  Procedure(s) Performed: AN AD HOC LABOR EPIDURAL     Patient location during evaluation: Mother Baby Anesthesia Type: Epidural Level of consciousness: awake and alert Pain management: pain level controlled Vital Signs Assessment: post-procedure vital signs reviewed and stable Respiratory status: spontaneous breathing, nonlabored ventilation and respiratory function stable Cardiovascular status: stable Postop Assessment: no headache, no backache, epidural receding, no apparent nausea or vomiting, patient able to bend at knees, adequate PO intake and able to ambulate Anesthetic complications: no   No complications documented.  Last Vitals:  Vitals:   08/27/19 0254 08/27/19 0634  BP: (!) 142/92 139/90  Pulse: 85 93  Resp:    Temp:    SpO2:      Last Pain:  Vitals:   08/27/19 0710  TempSrc:   PainSc: 0-No pain   Pain Goal: Patients Stated Pain Goal: 4 (08/26/19 1552)                 Laban Emperor

## 2019-08-27 NOTE — Progress Notes (Signed)
Post Partum Day 1 Subjective: no complaints, up ad lib, voiding and tolerating PO  Objective: Blood pressure 139/90, pulse 93, temperature 98.1 F (36.7 C), temperature source Oral, resp. rate 18, height 5' 7.5" (1.715 m), weight 112.5 kg, SpO2 100 %.  Physical Exam:  General: alert, cooperative, appears stated age and no distress Lochia: appropriate Uterine Fundus: firm Incision: healing well DVT Evaluation: No evidence of DVT seen on physical exam.  Recent Labs    08/26/19 1557 08/27/19 0223  HGB 13.8 11.9*  HCT 42.1 36.2    Assessment/Plan: Plan for discharge tomorrow, Breastfeeding and Circumcision prior to discharge   LOS: 1 day   Turner Daniels 08/27/2019, 9:46 AM

## 2019-08-27 NOTE — Lactation Note (Signed)
This note was copied from a baby's chart. Lactation Consultation Note  Patient Name: Dawn Friedman [id number] Date: 08/27/2019 Reason for consult: Initial assessment;Early term 37-38.6wks;Infant < 6lbs;Nipple pain/trauma   Infant is 38 weeks, 22 hours old and < 6 lbs. LC assisted with latch in football, but infant sleepy at the breast. LC gave 2 ml of 20 cal Similac but infant not active and continues to fall asleep.   Education provided to the mother on hand expression, spoon feeding and DEBP started. Reviewed with the mother milk storage, pumping and assembly of pump parts.   Goal for the Mom is to feed 8-12 x in 24 hour period, followed by pumping every 2-3 hours in the day and decrease in the evening q 3- 4 hours. Mom knows to contact [facility] or RN to assist with next latch. Mom will follow the LPT infant guideline to supplement with Similac 20 cal after nursing.   Mom has a Lansinoh DEBP at home.    Interventions Interventions: Breast feeding basics reviewed;Position options;Coconut oil;Breast massage;Hand express;Comfort gels;DEBP;Adjust position;Support pillows;Breast compression;Assisted with latch  Lactation Tools Discussed/Used Tools: Pump;Flanges;Coconut oil;Comfort gels Flange Size: 36 Breast pump type: Double-Electric Breast Pump Pump Review: Setup, frequency, and cleaning;Milk Storage Initiated by:: IBCLC Date initiated:: 08/27/19   Consult Status Consult Status: Follow-up Date: 08/28/19 Follow-up type: In-patient    Janis Cuffe  Nicholson-Springer 08/27/2019, 4:17 PM

## 2019-08-27 NOTE — Progress Notes (Signed)
CSW received consult for hx of Anxiety and Depression.  CSW met with MOB at bedside to offer support and complete assessment. On arrival, CSW introduce self and stated reason for visit. MOB in the room alone with infant Dawn Friedman. MOB was pleasant and engaged during visit.   MOB confirmed hx of anxiety and depression. However, MOB stated sx such as feeling overwhelmed and sadness regarding death of mother have been managed with active Rx, Zoloft. MOB denied any SI, HI, or domestic violence. MOB identified godmother and friends as support and coping skills as a good cry, work, and reminding self of children/purpose. MOB identified current mood as excited.   CSW provided education regarding the baby blues period vs. perinatal mood disorders, discussed treatment and gave resources for mental health follow up if concerns arise.  CSW recommends self-evaluation during the postpartum time period using the New Mom Checklist from Postpartum Progress and encouraged MOB to contact a medical professional if symptoms are noted at any time. MOB denied any postpartum hx, stated understanding, and denied any questions.    CSW provided review of Sudden Infant Death Syndrome (SIDS) precautions. MOB stated understanding and denied any questions. MOB confirmed having all needed items for baby including car seat and crib, bassinet, and playpen for baby's safe.     CSW identifies no further need for intervention and no barriers to discharge at this time.  Dawn Friedman D. Quantasia Stegner, MSW, LCSW Clinical Social Worker [phone] 

## 2019-08-28 ENCOUNTER — Encounter (HOSPITAL_COMMUNITY): Payer: Self-pay | Admitting: *Deleted

## 2019-08-28 NOTE — Lactation Note (Signed)
This note was copied from a baby's chart. Lactation Consultation Note  Patient Name: Dawn Friedman [id number] Date: 08/28/2019 Reason for consult: Follow-up assessment;Early term 37-38.6wks;Infant < 6lbs   Infant is 42 hours old and 38 weeks. Infant is taking formula up to 30 ounces per feed on Similac Ad Iron 20 calories. Mom has been pumping using the DEBP the night before, three times for 15 minutes. She has not been able to collect any breast milk from pumping or hand expression. Mom is currently on Procardia for HTN and Zoloft for Anxiety.   Mom has a Lansinoh DEBP at home. She states improvement with her nipple discomfort when pumping using the coconut oil and the bigger flange size.   Education provided to increase frequency of her pumping to get her milk in. Also recommended Mom f/u with [facility] outpatient lactation services. Mom agreed and will request [facility] outpatient services to contact Mom for an appointment. Engorgement conversation from yesterday recalled during our visit today.  LC given contact information for outpatient services to the Mom.   Maternal Data Has patient been taught Hand Expression?: Yes Does the patient have breastfeeding experience prior to this delivery?: Yes  Feeding Feeding Type: Bottle Fed - Formula Nipple Type: Slow - flow                 Interventions Interventions: Breast feeding basics reviewed;Coconut oil;Hand express;Breast massage;DEBP  Lactation Tools Discussed/Used Tools: Bottle;Coconut oil;Pump Flange Size: 36 Breast pump type: Double-Electric Breast Pump WIC Program: Yes Pump Review: Setup, frequency, and cleaning;Milk Storage Initiated by:: IBCLC Date initiated:: 08/27/19   Consult Status Consult Status: Complete Date: 08/28/19    Johnnell Liou  Nicholson-Springer 08/28/2019, 12:40 PM

## 2019-08-28 NOTE — Discharge Summary (Signed)
Postpartum Discharge Summary  Date of Service updated8/15/21     Patient Name: Dawn Friedman DOB: 1980-06-06 MRN: [id number]  Date of admission: 08/26/2019 Delivery date:08/26/2019  Delivering provider: Starr Lake  Date of discharge: 08/28/2019  Admitting diagnosis: Encounter for elective induction of labor [Z34.90] Intrauterine pregnancy: [redacted]w[redacted]d    Secondary diagnosis:  Active Problems:   Encounter for elective induction of labor  Additional problems: chronic HTN    Discharge diagnosis: Term Pregnancy Delivered                                              Post partum procedures:  Augmentation: AROM, Pitocin and Cytotec Complications: None  Hospital course: Induction of Labor With Vaginal Delivery   39y.o. yo G3P1011 at 346w3das admitted to the hospital 08/26/2019 for induction of labor.  Indication for induction: chronic HTN on meds.  Patient had an uncomplicated labor course as follows: Membrane Rupture Time/Date: 8:40 AM ,08/26/2019   Delivery Method:Vaginal, Spontaneous  Episiotomy: None  Lacerations:  None  Details of delivery can be found in separate delivery note.  Patient had a routine postpartum course. Patient is discharged home 08/28/19.  Newborn Data: Birth date:08/26/2019  Birth time:6:11 PM  Gender:Female  Living status:Living  Apgars:9 ,9  Weight:2495 g   Magnesium Sulfate received: No BMZ received: No Rhophylac:N/A MMR:N/A T-DaP:Given prenatally Flu: N/A Transfusion:No  Physical exam  Vitals:   08/27/19 1115 08/27/19 1430 08/27/19 2038 08/28/19 0645  BP: 131/65 124/77 135/80 126/71  Pulse: (!) 102 85 86 94  Resp: '18 16 15 18  '$ Temp: 98.5 F (36.9 C) 98.5 F (36.9 C) 98.6 F (37 C) 98.1 F (36.7 C)  TempSrc: Oral Oral Oral Oral  SpO2:  100% 99% 100%  Weight:      Height:       General: alert, cooperative and no distress Lochia: appropriate Uterine Fundus: firm Incision: Healing well with no significant drainage DVT  Evaluation: No evidence of DVT seen on physical exam. Labs: Lab Results  Component Value Date   WBC 19.2 (H) 08/27/2019   HGB 11.9 (L) 08/27/2019   HCT 36.2 08/27/2019   MCV 83.0 08/27/2019   PLT 255 08/27/2019   CMP Latest Ref Rng & Units 08/26/2019  Glucose 70 - 99 mg/dL 114(H)  BUN 6 - 20 mg/dL 6  Creatinine 0.44 - 1.00 mg/dL 0.67  Sodium 135 - 145 mmol/L 135  Potassium 3.5 - 5.1 mmol/L 3.8  Chloride 98 - 111 mmol/L 105  CO2 22 - 32 mmol/L 21(L)  Calcium 8.9 - 10.3 mg/dL 9.4  Total Protein 6.5 - 8.1 g/dL 6.6  Total Bilirubin 0.3 - 1.2 mg/dL 0.2(L)  Alkaline Phos 38 - 126 U/L 133(H)  AST 15 - 41 U/L 22  ALT 0 - 44 U/L 16   Edinburgh Score: Edinburgh Postnatal Depression Scale Screening Tool 08/27/2019  I have been able to laugh and see the funny side of things. 0  I have looked forward with enjoyment to things. 0  I have blamed myself unnecessarily when things went wrong. 1  I have been anxious or worried for no good reason. 0  I have felt scared or panicky for no good reason. 0  Things have been getting on top of me. 1  I have been so unhappy that I have had difficulty sleeping. 0  I have felt sad or miserable. 0  I have been so unhappy that I have been crying. 1  The thought of harming myself has occurred to me. 0  Edinburgh Postnatal Depression Scale Total 3      After visit meds:  Allergies as of 08/28/2019      Reactions   Amoxicillin Rash   Has patient had a PCN reaction causing immediate rash, facial/tongue/throat swelling, SOB or lightheadedness with hypotension: No Has patient had a PCN reaction causing severe rash involving mucus membranes or skin necrosis: No Has patient had a PCN reaction that required hospitalization No Has patient had a PCN reaction occurring within the last 10 years: No If all of the above answers are "NO", then may proceed with Cephalosporin use.   Latex Other (See Comments)   Irritation and dries her skin out      Medication List     STOP taking these medications   aspirin EC 81 MG tablet   doxylamine (Sleep) 25 MG tablet Commonly known as: UNISOM   folic acid 1 MG tablet Commonly known as: FOLVITE   valACYclovir 500 MG tablet Commonly known as: VALTREX     TAKE these medications   acetaminophen 325 MG tablet Commonly known as: TYLENOL Take 650 mg by mouth every 6 (six) hours as needed for mild pain.   NIFEdipine 60 MG 24 hr tablet Commonly known as: ADALAT CC Take 1 tablet (60 mg total) by mouth daily.   prenatal multivitamin Tabs tablet Take 1 tablet by mouth daily at 12 noon.   sertraline 25 MG tablet Commonly known as: ZOLOFT Take 25 mg by mouth daily.        Discharge home in stable condition Infant Feeding: Breast Infant Disposition:home with mother Discharge instruction: per After Visit Summary and Postpartum booklet. Activity: Advance as tolerated. Pelvic rest for 6 weeks.  Diet: routine diet Anticipated Birth Control: Unsure Postpartum Appointment: 6 Additional Postpartum F/U:   Future Appointments:No future appointments. Follow up Visit:      08/28/2019 Luz Lex, MD

## 2019-08-30 ENCOUNTER — Inpatient Hospital Stay (HOSPITAL_COMMUNITY): Payer: 59

## 2019-08-30 ENCOUNTER — Encounter (HOSPITAL_COMMUNITY): Payer: Medicaid Other

## 2019-09-14 DIAGNOSIS — Z419 Encounter for procedure for purposes other than remedying health state, unspecified: Secondary | ICD-10-CM | POA: Diagnosis not present

## 2019-10-14 DIAGNOSIS — Z419 Encounter for procedure for purposes other than remedying health state, unspecified: Secondary | ICD-10-CM | POA: Diagnosis not present

## 2019-11-14 DIAGNOSIS — Z419 Encounter for procedure for purposes other than remedying health state, unspecified: Secondary | ICD-10-CM | POA: Diagnosis not present

## 2019-12-14 DIAGNOSIS — Z419 Encounter for procedure for purposes other than remedying health state, unspecified: Secondary | ICD-10-CM | POA: Diagnosis not present

## 2019-12-30 DIAGNOSIS — Z13228 Encounter for screening for other metabolic disorders: Secondary | ICD-10-CM | POA: Diagnosis not present

## 2019-12-30 DIAGNOSIS — Z13220 Encounter for screening for lipoid disorders: Secondary | ICD-10-CM | POA: Diagnosis not present

## 2019-12-30 DIAGNOSIS — Z1329 Encounter for screening for other suspected endocrine disorder: Secondary | ICD-10-CM | POA: Diagnosis not present

## 2019-12-30 DIAGNOSIS — L219 Seborrheic dermatitis, unspecified: Secondary | ICD-10-CM | POA: Diagnosis not present

## 2019-12-30 DIAGNOSIS — Z87891 Personal history of nicotine dependence: Secondary | ICD-10-CM | POA: Diagnosis not present

## 2019-12-30 DIAGNOSIS — Z0000 Encounter for general adult medical examination without abnormal findings: Secondary | ICD-10-CM | POA: Diagnosis not present

## 2019-12-30 DIAGNOSIS — Z800 Family history of malignant neoplasm of digestive organs: Secondary | ICD-10-CM | POA: Diagnosis not present

## 2019-12-30 DIAGNOSIS — Z130 Encounter for screening for diseases of the blood and blood-forming organs and certain disorders involving the immune mechanism: Secondary | ICD-10-CM | POA: Diagnosis not present

## 2019-12-30 DIAGNOSIS — I10 Essential (primary) hypertension: Secondary | ICD-10-CM | POA: Diagnosis not present

## 2020-01-14 DIAGNOSIS — Z419 Encounter for procedure for purposes other than remedying health state, unspecified: Secondary | ICD-10-CM | POA: Diagnosis not present

## 2020-02-14 DIAGNOSIS — Z419 Encounter for procedure for purposes other than remedying health state, unspecified: Secondary | ICD-10-CM | POA: Diagnosis not present

## 2020-03-13 DIAGNOSIS — Z419 Encounter for procedure for purposes other than remedying health state, unspecified: Secondary | ICD-10-CM | POA: Diagnosis not present

## 2020-03-30 DIAGNOSIS — E6609 Other obesity due to excess calories: Secondary | ICD-10-CM | POA: Diagnosis not present

## 2020-03-30 DIAGNOSIS — I10 Essential (primary) hypertension: Secondary | ICD-10-CM | POA: Diagnosis not present

## 2020-03-30 DIAGNOSIS — Z6839 Body mass index (BMI) 39.0-39.9, adult: Secondary | ICD-10-CM | POA: Diagnosis not present

## 2020-03-30 DIAGNOSIS — K219 Gastro-esophageal reflux disease without esophagitis: Secondary | ICD-10-CM | POA: Diagnosis not present

## 2020-04-13 DIAGNOSIS — Z419 Encounter for procedure for purposes other than remedying health state, unspecified: Secondary | ICD-10-CM | POA: Diagnosis not present

## 2020-05-13 DIAGNOSIS — Z419 Encounter for procedure for purposes other than remedying health state, unspecified: Secondary | ICD-10-CM | POA: Diagnosis not present

## 2020-05-18 DIAGNOSIS — N83209 Unspecified ovarian cyst, unspecified side: Secondary | ICD-10-CM | POA: Diagnosis not present

## 2020-05-18 DIAGNOSIS — L689 Hypertrichosis, unspecified: Secondary | ICD-10-CM | POA: Diagnosis not present

## 2020-05-18 DIAGNOSIS — Z309 Encounter for contraceptive management, unspecified: Secondary | ICD-10-CM | POA: Diagnosis not present

## 2020-05-18 DIAGNOSIS — L292 Pruritus vulvae: Secondary | ICD-10-CM | POA: Diagnosis not present

## 2020-06-13 DIAGNOSIS — Z419 Encounter for procedure for purposes other than remedying health state, unspecified: Secondary | ICD-10-CM | POA: Diagnosis not present

## 2020-06-26 ENCOUNTER — Other Ambulatory Visit: Payer: Self-pay | Admitting: Obstetrics and Gynecology

## 2020-06-26 DIAGNOSIS — R928 Other abnormal and inconclusive findings on diagnostic imaging of breast: Secondary | ICD-10-CM

## 2020-06-29 ENCOUNTER — Ambulatory Visit
Admission: RE | Admit: 2020-06-29 | Discharge: 2020-06-29 | Disposition: A | Discharge status: Home / Self Care | Payer: 59 | Source: Ambulatory Visit | Attending: Obstetrics and Gynecology | Admitting: Obstetrics and Gynecology

## 2020-06-29 ENCOUNTER — Other Ambulatory Visit: Payer: Self-pay | Admitting: Obstetrics and Gynecology

## 2020-06-29 ENCOUNTER — Other Ambulatory Visit: Payer: Self-pay

## 2020-06-29 DIAGNOSIS — R928 Other abnormal and inconclusive findings on diagnostic imaging of breast: Secondary | ICD-10-CM

## 2020-06-29 DIAGNOSIS — E6609 Other obesity due to excess calories: Secondary | ICD-10-CM | POA: Diagnosis not present

## 2020-06-29 DIAGNOSIS — R4586 Emotional lability: Secondary | ICD-10-CM | POA: Diagnosis not present

## 2020-06-29 DIAGNOSIS — Z6839 Body mass index (BMI) 39.0-39.9, adult: Secondary | ICD-10-CM | POA: Diagnosis not present

## 2020-06-29 DIAGNOSIS — N6310 Unspecified lump in the right breast, unspecified quadrant: Secondary | ICD-10-CM

## 2020-06-29 DIAGNOSIS — I10 Essential (primary) hypertension: Secondary | ICD-10-CM | POA: Diagnosis not present

## 2020-06-29 DIAGNOSIS — R922 Inconclusive mammogram: Secondary | ICD-10-CM | POA: Diagnosis not present

## 2020-07-11 ENCOUNTER — Other Ambulatory Visit: Payer: Self-pay

## 2020-07-11 ENCOUNTER — Ambulatory Visit
Admission: RE | Admit: 2020-07-11 | Discharge: 2020-07-11 | Disposition: A | Discharge status: Home / Self Care | Payer: 59 | Source: Ambulatory Visit | Attending: Obstetrics and Gynecology | Admitting: Obstetrics and Gynecology

## 2020-07-11 DIAGNOSIS — N6310 Unspecified lump in the right breast, unspecified quadrant: Secondary | ICD-10-CM

## 2020-07-11 DIAGNOSIS — N6311 Unspecified lump in the right breast, upper outer quadrant: Secondary | ICD-10-CM | POA: Diagnosis not present

## 2020-07-13 DIAGNOSIS — Z419 Encounter for procedure for purposes other than remedying health state, unspecified: Secondary | ICD-10-CM | POA: Diagnosis not present

## 2020-07-25 DIAGNOSIS — R509 Fever, unspecified: Secondary | ICD-10-CM | POA: Diagnosis not present

## 2020-07-25 DIAGNOSIS — Z20822 Contact with and (suspected) exposure to covid-19: Secondary | ICD-10-CM | POA: Diagnosis not present

## 2020-08-13 DIAGNOSIS — Z419 Encounter for procedure for purposes other than remedying health state, unspecified: Secondary | ICD-10-CM | POA: Diagnosis not present

## 2020-09-07 DIAGNOSIS — Z309 Encounter for contraceptive management, unspecified: Secondary | ICD-10-CM | POA: Diagnosis not present

## 2020-09-07 DIAGNOSIS — N83201 Unspecified ovarian cyst, right side: Secondary | ICD-10-CM | POA: Diagnosis not present

## 2020-09-07 DIAGNOSIS — L689 Hypertrichosis, unspecified: Secondary | ICD-10-CM | POA: Diagnosis not present

## 2020-09-07 DIAGNOSIS — D251 Intramural leiomyoma of uterus: Secondary | ICD-10-CM | POA: Diagnosis not present

## 2020-09-07 DIAGNOSIS — D252 Subserosal leiomyoma of uterus: Secondary | ICD-10-CM | POA: Diagnosis not present

## 2020-09-07 DIAGNOSIS — N83209 Unspecified ovarian cyst, unspecified side: Secondary | ICD-10-CM | POA: Diagnosis not present

## 2020-09-13 DIAGNOSIS — Z419 Encounter for procedure for purposes other than remedying health state, unspecified: Secondary | ICD-10-CM | POA: Diagnosis not present

## 2020-10-13 DIAGNOSIS — Z419 Encounter for procedure for purposes other than remedying health state, unspecified: Secondary | ICD-10-CM | POA: Diagnosis not present

## 2020-11-13 DIAGNOSIS — Z419 Encounter for procedure for purposes other than remedying health state, unspecified: Secondary | ICD-10-CM | POA: Diagnosis not present

## 2020-12-13 DIAGNOSIS — Z419 Encounter for procedure for purposes other than remedying health state, unspecified: Secondary | ICD-10-CM | POA: Diagnosis not present

## 2021-01-13 DIAGNOSIS — Z419 Encounter for procedure for purposes other than remedying health state, unspecified: Secondary | ICD-10-CM | POA: Diagnosis not present

## 2021-02-01 DIAGNOSIS — M25562 Pain in left knee: Secondary | ICD-10-CM | POA: Diagnosis not present

## 2021-02-01 DIAGNOSIS — M25561 Pain in right knee: Secondary | ICD-10-CM | POA: Diagnosis not present

## 2021-02-11 DIAGNOSIS — L689 Hypertrichosis, unspecified: Secondary | ICD-10-CM | POA: Diagnosis not present

## 2021-02-11 DIAGNOSIS — Z309 Encounter for contraceptive management, unspecified: Secondary | ICD-10-CM | POA: Diagnosis not present

## 2021-02-11 DIAGNOSIS — N939 Abnormal uterine and vaginal bleeding, unspecified: Secondary | ICD-10-CM | POA: Diagnosis not present

## 2021-02-11 DIAGNOSIS — N83209 Unspecified ovarian cyst, unspecified side: Secondary | ICD-10-CM | POA: Diagnosis not present

## 2021-02-11 DIAGNOSIS — Z3202 Encounter for pregnancy test, result negative: Secondary | ICD-10-CM | POA: Diagnosis not present

## 2021-02-11 DIAGNOSIS — I10 Essential (primary) hypertension: Secondary | ICD-10-CM | POA: Diagnosis not present

## 2021-02-11 DIAGNOSIS — E669 Obesity, unspecified: Secondary | ICD-10-CM | POA: Diagnosis not present

## 2021-02-11 DIAGNOSIS — Z319 Encounter for procreative management, unspecified: Secondary | ICD-10-CM | POA: Diagnosis not present

## 2021-02-13 DIAGNOSIS — Z419 Encounter for procedure for purposes other than remedying health state, unspecified: Secondary | ICD-10-CM | POA: Diagnosis not present

## 2021-03-13 DIAGNOSIS — Z419 Encounter for procedure for purposes other than remedying health state, unspecified: Secondary | ICD-10-CM | POA: Diagnosis not present

## 2021-03-13 DIAGNOSIS — Z87891 Personal history of nicotine dependence: Secondary | ICD-10-CM | POA: Diagnosis not present

## 2021-03-13 DIAGNOSIS — Z0000 Encounter for general adult medical examination without abnormal findings: Secondary | ICD-10-CM | POA: Diagnosis not present

## 2021-03-13 DIAGNOSIS — K921 Melena: Secondary | ICD-10-CM | POA: Diagnosis not present

## 2021-03-13 DIAGNOSIS — H6122 Impacted cerumen, left ear: Secondary | ICD-10-CM | POA: Diagnosis not present

## 2021-03-13 DIAGNOSIS — L219 Seborrheic dermatitis, unspecified: Secondary | ICD-10-CM | POA: Diagnosis not present

## 2021-03-13 DIAGNOSIS — R7303 Prediabetes: Secondary | ICD-10-CM | POA: Diagnosis not present

## 2021-03-13 DIAGNOSIS — I10 Essential (primary) hypertension: Secondary | ICD-10-CM | POA: Diagnosis not present

## 2021-03-13 DIAGNOSIS — Z0001 Encounter for general adult medical examination with abnormal findings: Secondary | ICD-10-CM | POA: Diagnosis not present

## 2021-03-13 DIAGNOSIS — Z800 Family history of malignant neoplasm of digestive organs: Secondary | ICD-10-CM | POA: Diagnosis not present

## 2021-04-13 DIAGNOSIS — Z419 Encounter for procedure for purposes other than remedying health state, unspecified: Secondary | ICD-10-CM | POA: Diagnosis not present

## 2021-04-26 DIAGNOSIS — R12 Heartburn: Secondary | ICD-10-CM | POA: Diagnosis not present

## 2021-04-26 DIAGNOSIS — K921 Melena: Secondary | ICD-10-CM | POA: Diagnosis not present

## 2021-04-26 DIAGNOSIS — Z800 Family history of malignant neoplasm of digestive organs: Secondary | ICD-10-CM | POA: Diagnosis not present

## 2021-05-13 DIAGNOSIS — Z419 Encounter for procedure for purposes other than remedying health state, unspecified: Secondary | ICD-10-CM | POA: Diagnosis not present

## 2021-06-13 DIAGNOSIS — Z419 Encounter for procedure for purposes other than remedying health state, unspecified: Secondary | ICD-10-CM | POA: Diagnosis not present

## 2021-07-03 DIAGNOSIS — K2950 Unspecified chronic gastritis without bleeding: Secondary | ICD-10-CM | POA: Diagnosis not present

## 2021-07-03 DIAGNOSIS — K635 Polyp of colon: Secondary | ICD-10-CM | POA: Diagnosis not present

## 2021-07-03 DIAGNOSIS — Z800 Family history of malignant neoplasm of digestive organs: Secondary | ICD-10-CM | POA: Diagnosis not present

## 2021-07-03 DIAGNOSIS — K921 Melena: Secondary | ICD-10-CM | POA: Diagnosis not present

## 2021-07-03 DIAGNOSIS — K5730 Diverticulosis of large intestine without perforation or abscess without bleeding: Secondary | ICD-10-CM | POA: Diagnosis not present

## 2021-07-03 DIAGNOSIS — R12 Heartburn: Secondary | ICD-10-CM | POA: Diagnosis not present

## 2021-07-13 DIAGNOSIS — Z419 Encounter for procedure for purposes other than remedying health state, unspecified: Secondary | ICD-10-CM | POA: Diagnosis not present

## 2021-08-13 DIAGNOSIS — Z419 Encounter for procedure for purposes other than remedying health state, unspecified: Secondary | ICD-10-CM | POA: Diagnosis not present

## 2021-09-13 DIAGNOSIS — Z419 Encounter for procedure for purposes other than remedying health state, unspecified: Secondary | ICD-10-CM | POA: Diagnosis not present

## 2021-09-23 DIAGNOSIS — I10 Essential (primary) hypertension: Secondary | ICD-10-CM | POA: Diagnosis not present

## 2021-09-23 DIAGNOSIS — Z6839 Body mass index (BMI) 39.0-39.9, adult: Secondary | ICD-10-CM | POA: Diagnosis not present

## 2021-09-23 DIAGNOSIS — R7303 Prediabetes: Secondary | ICD-10-CM | POA: Diagnosis not present

## 2021-10-13 DIAGNOSIS — Z419 Encounter for procedure for purposes other than remedying health state, unspecified: Secondary | ICD-10-CM | POA: Diagnosis not present

## 2021-11-13 DIAGNOSIS — Z419 Encounter for procedure for purposes other than remedying health state, unspecified: Secondary | ICD-10-CM | POA: Diagnosis not present

## 2021-12-13 DIAGNOSIS — Z419 Encounter for procedure for purposes other than remedying health state, unspecified: Secondary | ICD-10-CM | POA: Diagnosis not present

## 2022-01-13 DIAGNOSIS — Z419 Encounter for procedure for purposes other than remedying health state, unspecified: Secondary | ICD-10-CM | POA: Diagnosis not present

## 2022-02-09 ENCOUNTER — Emergency Department (HOSPITAL_COMMUNITY): Payer: Medicaid Other

## 2022-02-09 ENCOUNTER — Encounter (HOSPITAL_COMMUNITY): Payer: Self-pay

## 2022-02-09 ENCOUNTER — Other Ambulatory Visit: Payer: Self-pay

## 2022-02-09 ENCOUNTER — Emergency Department (HOSPITAL_COMMUNITY)
Admission: EM | Admit: 2022-02-09 | Discharge: 2022-02-09 | Disposition: A | Discharge status: Home / Self Care | Payer: Medicaid Other | Attending: Emergency Medicine | Admitting: Emergency Medicine

## 2022-02-09 DIAGNOSIS — Z91040 Latex allergy status: Secondary | ICD-10-CM | POA: Insufficient documentation

## 2022-02-09 DIAGNOSIS — R109 Unspecified abdominal pain: Secondary | ICD-10-CM | POA: Diagnosis not present

## 2022-02-09 DIAGNOSIS — R110 Nausea: Secondary | ICD-10-CM | POA: Diagnosis not present

## 2022-02-09 DIAGNOSIS — R112 Nausea with vomiting, unspecified: Secondary | ICD-10-CM | POA: Insufficient documentation

## 2022-02-09 DIAGNOSIS — R1012 Left upper quadrant pain: Secondary | ICD-10-CM | POA: Insufficient documentation

## 2022-02-09 DIAGNOSIS — R1032 Left lower quadrant pain: Secondary | ICD-10-CM | POA: Diagnosis not present

## 2022-02-09 DIAGNOSIS — R1011 Right upper quadrant pain: Secondary | ICD-10-CM | POA: Diagnosis not present

## 2022-02-09 LAB — CBC WITH DIFFERENTIAL/PLATELET
Abs Immature Granulocytes: 0.05 10*3/uL (ref 0.00–0.07)
Basophils Absolute: 0.1 10*3/uL (ref 0.0–0.1)
Basophils Relative: 0 %
Eosinophils Absolute: 0.1 10*3/uL (ref 0.0–0.5)
Eosinophils Relative: 1 %
HCT: 38.6 % (ref 36.0–46.0)
Hemoglobin: 12.1 g/dL (ref 12.0–15.0)
Immature Granulocytes: 0 %
Lymphocytes Relative: 16 %
Lymphs Abs: 2.2 10*3/uL (ref 0.7–4.0)
MCH: 22.5 pg — ABNORMAL LOW (ref 26.0–34.0)
MCHC: 31.3 g/dL (ref 30.0–36.0)
MCV: 71.7 fL — ABNORMAL LOW (ref 80.0–100.0)
Monocytes Absolute: 0.7 10*3/uL (ref 0.1–1.0)
Monocytes Relative: 5 %
Neutro Abs: 11.3 10*3/uL — ABNORMAL HIGH (ref 1.7–7.7)
Neutrophils Relative %: 78 %
Platelets: 483 10*3/uL — ABNORMAL HIGH (ref 150–400)
RBC: 5.38 MIL/uL — ABNORMAL HIGH (ref 3.87–5.11)
RDW: 19 % — ABNORMAL HIGH (ref 11.5–15.5)
WBC: 14.4 10*3/uL — ABNORMAL HIGH (ref 4.0–10.5)
nRBC: 0 % (ref 0.0–0.2)

## 2022-02-09 LAB — COMPREHENSIVE METABOLIC PANEL
ALT: 15 U/L (ref 0–44)
AST: 15 U/L (ref 15–41)
Albumin: 4 g/dL (ref 3.5–5.0)
Alkaline Phosphatase: 67 U/L (ref 38–126)
Anion gap: 7 (ref 5–15)
BUN: 9 mg/dL (ref 6–20)
CO2: 22 mmol/L (ref 22–32)
Calcium: 9 mg/dL (ref 8.9–10.3)
Chloride: 104 mmol/L (ref 98–111)
Creatinine, Ser: 0.66 mg/dL (ref 0.44–1.00)
GFR, Estimated: >60 mL/min (ref >60)
Glucose, Bld: 102 mg/dL — ABNORMAL HIGH (ref 70–99)
Potassium: 3.8 mmol/L (ref 3.5–5.1)
Sodium: 133 mmol/L — ABNORMAL LOW (ref 135–145)
Total Bilirubin: 0.4 mg/dL (ref 0.3–1.2)
Total Protein: 8.6 g/dL — ABNORMAL HIGH (ref 6.5–8.1)

## 2022-02-09 LAB — URINALYSIS, ROUTINE W REFLEX MICROSCOPIC
Bilirubin Urine: NEGATIVE
Glucose, UA: NEGATIVE mg/dL
Hgb urine dipstick: NEGATIVE
Ketones, ur: NEGATIVE mg/dL
Nitrite: NEGATIVE
Protein, ur: NEGATIVE mg/dL
Specific Gravity, Urine: 1.018 (ref 1.005–1.030)
pH: 5 (ref 5.0–8.0)

## 2022-02-09 LAB — PREGNANCY, URINE: Preg Test, Ur: NEGATIVE

## 2022-02-09 LAB — LIPASE, BLOOD: Lipase: 27 U/L (ref 11–51)

## 2022-02-09 MED ORDER — FAMOTIDINE IN NACL 20-0.9 MG/50ML-% IV SOLN
20.0000 mg | Freq: Once | INTRAVENOUS | Status: AC
  Administered 2022-02-09: 20 mg via INTRAVENOUS
  Filled 2022-02-09: qty 50

## 2022-02-09 MED ORDER — ONDANSETRON HCL 4 MG PO TABS: 4.0000 mg | ORAL_TABLET | Freq: Four times a day (QID) | ORAL | 0 refills | Status: DC

## 2022-02-09 MED ORDER — ACETAMINOPHEN 500 MG PO TABS
1000.0000 mg | ORAL_TABLET | Freq: Once | ORAL | Status: AC
  Administered 2022-02-09: 1000 mg via ORAL
  Filled 2022-02-09: qty 2

## 2022-02-09 MED ORDER — SODIUM CHLORIDE 0.9 % IV BOLUS
1000.0000 mL | Freq: Once | INTRAVENOUS | Status: AC
  Administered 2022-02-09: 1000 mL via INTRAVENOUS

## 2022-02-09 MED ORDER — FAMOTIDINE 20 MG PO TABS: 20.0000 mg | ORAL_TABLET | Freq: Two times a day (BID) | ORAL | 0 refills | Status: AC

## 2022-02-09 MED ORDER — ONDANSETRON HCL 4 MG/2ML IJ SOLN
4.0000 mg | Freq: Once | INTRAMUSCULAR | Status: AC
  Administered 2022-02-09: 4 mg via INTRAVENOUS
  Filled 2022-02-09: qty 2

## 2022-02-09 NOTE — ED Provider Notes (Signed)
[place] EMERGENCY DEPARTMENT AT [facility] Provider Note   CSN: [id number] Arrival date & time: 02/09/22  1200     History  Chief Complaint  Patient presents with   Abdominal Pain    Dawn Friedman is a 42 y.o. female.  42 year old female with prior medical history as detailed below presents for evaluation.  Patient complains of mild nausea with 2 episodes of vomiting.  Symptoms began approximately 2 days ago.  She complains of crampy discomfort in the left upper quadrant of her abdomen as well.  Patient was seen at urgent care and referred to the ED for further workup.  On arrival to the ED, she reports that she is feeling improved.  Do not take anything for symptoms at home.  She denies sick contacts.  She denies fever.  She denies diarrhea.    The history is provided by the patient and medical records.       Home Medications Prior to Admission medications   Medication Sig Start Date End Date Taking? Authorizing Provider  acetaminophen (TYLENOL) 325 MG tablet Take 650 mg by mouth every 6 (six) hours as needed for mild pain.     [provider]  NIFEdipine (ADALAT CC) 60 MG 24 hr tablet Take 1 tablet (60 mg total) by mouth daily. 07/30/19   [name], MD  Prenatal Vit-Fe Fumarate-FA (PRENATAL MULTIVITAMIN) TABS tablet Take 1 tablet by mouth daily at 12 noon.    [provider]  sertraline (ZOLOFT) 25 MG tablet Take 25 mg by mouth daily.    [provider]      Allergies    Amoxicillin and Latex    Review of Systems   Review of Systems  All other systems reviewed and are negative.   Physical Exam Updated Vital Signs BP (!) 139/94   Pulse 99   Temp 98.2 F (36.8 C)   Resp 18   Wt 112 kg   SpO2 99%   BMI 38.10 kg/m  Physical Exam Vitals and nursing note reviewed.  Constitutional:      General: She is not in acute distress.    Appearance: Normal appearance. She is well-developed.  HENT:     Head: Normocephalic  and atraumatic.  Eyes:     Conjunctiva/sclera: Conjunctivae normal.     Pupils: Pupils are equal, round, and reactive to light.  Cardiovascular:     Rate and Rhythm: Normal rate and regular rhythm.     Heart sounds: Normal heart sounds.  Pulmonary:     Effort: Pulmonary effort is normal. No respiratory distress.     Breath sounds: Normal breath sounds.  Abdominal:     General: There is no distension.     Palpations: Abdomen is soft.     Tenderness: There is no abdominal tenderness.  Musculoskeletal:        General: No deformity. Normal range of motion.     Cervical back: Normal range of motion and neck supple.  Skin:    General: Skin is warm and dry.  Neurological:     General: No focal deficit present.     Mental Status: She is alert and oriented to person, place, and time.     ED Results / Procedures / Treatments   Labs (all labs ordered are listed, but only abnormal results are displayed) Labs Reviewed  URINALYSIS, ROUTINE W REFLEX MICROSCOPIC - Abnormal; Notable for the following components:      Result Value   APPearance CLOUDY (*)  Leukocytes,Ua MODERATE (*)    Bacteria, UA RARE (*)    All other components within normal limits  COMPREHENSIVE METABOLIC PANEL - Abnormal; Notable for the following components:   Sodium 133 (*)    Glucose, Bld 102 (*)    Total Protein 8.6 (*)    All other components within normal limits  CBC WITH DIFFERENTIAL/PLATELET - Abnormal; Notable for the following components:   WBC 14.4 (*)    RBC 5.38 (*)    MCV 71.7 (*)    MCH 22.5 (*)    RDW 19.0 (*)    Platelets 483 (*)    Neutro Abs 11.3 (*)    All other components within normal limits  LIPASE, BLOOD  PREGNANCY, URINE    EKG None  Radiology US Abdomen Limited RUQ (LIVER/GB)  Result Date: 02/09/2022 CLINICAL DATA:  Right upper quadrant pain for 2 days EXAM: ULTRASOUND ABDOMEN LIMITED RIGHT UPPER QUADRANT COMPARISON:  None Available. FINDINGS: Gallbladder: No gallstones or wall  thickening visualized. No sonographic Murphy sign noted by sonographer. Common bile duct: Diameter: 4.2 mm Liver: No focal lesion identified. Within normal limits in parenchymal echogenicity. Portal vein is patent on color Doppler imaging with normal direction of blood flow towards the liver. Other: None. IMPRESSION: Normal study. No cause for pain identified. Electronically Signed   By: Dorise Bullion III M.D.   On: 02/09/2022 13:40    Procedures Procedures    Medications Ordered in ED Medications  ondansetron (ZOFRAN) injection 4 mg (4 mg Intravenous Given 02/09/22 1610)  famotidine (PEPCID) IVPB 20 mg premix (20 mg Intravenous New Bag/Given 02/09/22 1615)  sodium chloride 0.9 % bolus 1,000 mL (1,000 mLs Intravenous New Bag/Given 02/09/22 1613)  acetaminophen (TYLENOL) tablet 1,000 mg (1,000 mg Oral Given 02/09/22 1632)    ED Course/ Medical Decision Making/ A&P                             Medical Decision Making Amount and/or Complexity of Data Reviewed Labs: ordered.  Risk OTC drugs. Prescription drug management.    Medical Screen Complete  This patient presented to the ED with complaint of nausea, abdominal pain.  This complaint involves an extensive number of treatment options. The initial differential diagnosis includes, but is not limited to, viral infection, bacterial infection, metabolic abnormality, other significant intra-abdominal pathology  This presentation is: Acute, Self-Limited, Previously Undiagnosed, Uncertain Prognosis, Complicated, Systemic Symptoms, and Threat to Life/Bodily Function  Patient is presenting with nausea, left upper quadrant abdominal discomfort and cramping, and minimal emesis.  Screening labs obtained including CBC, CMP, lipase are without significant abnormality.  Patient without diarrhea or other change in bowel movement.  Patient without urinary symptoms.  Ultrasound of right upper quadrant and CT abdomen pelvis are without acute  abnormality.  Patient's symptoms are perhaps most consistent with likely viral gastritis.  Patient feels improved after ED evaluation.  She desires discharge home.  Importance of close follow-up is stressed.   Additional history obtained: External records from outside sources obtained and reviewed including prior ED visits and prior Inpatient records.    Lab Tests:  I ordered and personally interpreted labs.  The pertinent results include: CBC, CMP, lipase, pregnancy test, UA   Imaging Studies ordered:  I ordered imaging studies including CT abdomen pelvis, right upper quadrant ultrasound I independently visualized and interpreted obtained imaging which showed NAD I agree with the radiologist interpretation.   Cardiac Monitoring:  The patient was  maintained on a cardiac monitor.  I personally viewed and interpreted the cardiac monitor which showed an underlying rhythm of: NSR   Medicines ordered:  I ordered medication including Tylenol, Zofran, Pepcid, IV fluids for abdominal pain Reevaluation of the patient after these medicines showed that the patient: improved  Problem List / ED Course:  Abdominal pain   Reevaluation:  After the interventions noted above, I reevaluated the patient and found that they have: improved   Disposition:  After consideration of the diagnostic results and the patients response to treatment, I feel that the patent would benefit from close outpatient follow-up.          Final Clinical Impression(s) / ED Diagnoses Final diagnoses:  Nausea    Rx / DC Orders ED Discharge Orders          Ordered    ondansetron (ZOFRAN) 4 MG tablet  Every 6 hours        02/09/22 1822    famotidine (PEPCID) 20 MG tablet  2 times daily        02/09/22 1843              Valarie Merino, MD 02/09/22 1851

## 2022-02-09 NOTE — ED Triage Notes (Signed)
Sent from [facility] for gallbladder US per patient C/o sudden onset LUQ pain with n/v/fatigue x2 days

## 2022-02-09 NOTE — Discharge Instructions (Signed)
Return for any problem.  ?

## 2022-02-09 NOTE — ED Provider Triage Note (Signed)
Emergency Medicine Provider Triage Evaluation Note  SHONA PARDO , a 42 y.o. female  was evaluated in triage.  Pt complains of abdominal pain.  Patient reports she has had left lower quadrant abdominal pain with associated nausea and vomiting.  She was seen at urgent care earlier today and was advised to come to emergency department for evaluation.  Patient's pain appears to radiate from the epigastric space into the left upper quadrant.  Patient denies any chest pain, shortness of breath, fevers.  Prior history of gallbladder abnormalities seen on HIDA scan, patient has not followed up since then.  Review of Systems  Positive: As above Negative: As above  Physical Exam  BP (!) 142/116   Pulse 87   Temp 98.2 F (36.8 C)   Resp 20   Wt 112 kg   SpO2 100%   BMI 38.10 kg/m  Gen:   Awake, no distress Resp:  Normal effort MSK:   Moves extremities without difficulty Other:  Epigastric and left upper quadrant pain  Medical Decision Making  Medically screening exam initiated at 12:41 PM.  Appropriate orders placed.  CORIE ALLIS was informed that the remainder of the evaluation will be completed by another provider, this initial triage assessment does not replace that evaluation, and the importance of remaining in the ED until their evaluation is complete.     Luvenia Heller, PA-C 02/09/22 1243

## 2022-02-13 DIAGNOSIS — Z419 Encounter for procedure for purposes other than remedying health state, unspecified: Secondary | ICD-10-CM | POA: Diagnosis not present

## 2022-03-14 DIAGNOSIS — Z419 Encounter for procedure for purposes other than remedying health state, unspecified: Secondary | ICD-10-CM | POA: Diagnosis not present

## 2022-03-21 DIAGNOSIS — I10 Essential (primary) hypertension: Secondary | ICD-10-CM | POA: Diagnosis not present

## 2022-03-21 DIAGNOSIS — R7303 Prediabetes: Secondary | ICD-10-CM | POA: Diagnosis not present

## 2022-03-21 DIAGNOSIS — L219 Seborrheic dermatitis, unspecified: Secondary | ICD-10-CM | POA: Diagnosis not present

## 2022-03-21 DIAGNOSIS — Z0000 Encounter for general adult medical examination without abnormal findings: Secondary | ICD-10-CM | POA: Diagnosis not present

## 2022-03-21 DIAGNOSIS — Z6841 Body Mass Index (BMI) 40.0 and over, adult: Secondary | ICD-10-CM | POA: Diagnosis not present

## 2022-04-14 DIAGNOSIS — Z419 Encounter for procedure for purposes other than remedying health state, unspecified: Secondary | ICD-10-CM | POA: Diagnosis not present

## 2022-04-25 DIAGNOSIS — L219 Seborrheic dermatitis, unspecified: Secondary | ICD-10-CM | POA: Diagnosis not present

## 2022-05-14 DIAGNOSIS — Z419 Encounter for procedure for purposes other than remedying health state, unspecified: Secondary | ICD-10-CM | POA: Diagnosis not present

## 2022-05-19 DIAGNOSIS — I10 Essential (primary) hypertension: Secondary | ICD-10-CM | POA: Diagnosis not present

## 2022-06-14 DIAGNOSIS — Z419 Encounter for procedure for purposes other than remedying health state, unspecified: Secondary | ICD-10-CM | POA: Diagnosis not present

## 2022-07-11 DIAGNOSIS — E119 Type 2 diabetes mellitus without complications: Secondary | ICD-10-CM | POA: Diagnosis not present

## 2022-07-11 DIAGNOSIS — D508 Other iron deficiency anemias: Secondary | ICD-10-CM | POA: Diagnosis not present

## 2022-07-11 DIAGNOSIS — I10 Essential (primary) hypertension: Secondary | ICD-10-CM | POA: Diagnosis not present

## 2022-07-14 DIAGNOSIS — Z419 Encounter for procedure for purposes other than remedying health state, unspecified: Secondary | ICD-10-CM | POA: Diagnosis not present

## 2022-08-14 DIAGNOSIS — Z419 Encounter for procedure for purposes other than remedying health state, unspecified: Secondary | ICD-10-CM | POA: Diagnosis not present

## 2022-08-19 DIAGNOSIS — I10 Essential (primary) hypertension: Secondary | ICD-10-CM | POA: Diagnosis not present

## 2022-08-29 DIAGNOSIS — M545 Low back pain, unspecified: Secondary | ICD-10-CM | POA: Diagnosis not present

## 2022-08-29 DIAGNOSIS — I10 Essential (primary) hypertension: Secondary | ICD-10-CM | POA: Diagnosis not present

## 2022-09-04 ENCOUNTER — Encounter (HOSPITAL_COMMUNITY): Payer: Self-pay

## 2022-09-04 ENCOUNTER — Emergency Department (HOSPITAL_COMMUNITY): Payer: Medicaid Other

## 2022-09-04 ENCOUNTER — Emergency Department (HOSPITAL_COMMUNITY): Payer: Medicaid Other | Admitting: Anesthesiology

## 2022-09-04 ENCOUNTER — Encounter (HOSPITAL_COMMUNITY)
Admission: EM | Disposition: A | Discharge status: Home / Self Care | Payer: Self-pay | Source: Home / Self Care | Attending: Student

## 2022-09-04 ENCOUNTER — Other Ambulatory Visit: Payer: Self-pay

## 2022-09-04 ENCOUNTER — Emergency Department (HOSPITAL_BASED_OUTPATIENT_CLINIC_OR_DEPARTMENT_OTHER): Payer: Medicaid Other | Admitting: Anesthesiology

## 2022-09-04 ENCOUNTER — Ambulatory Visit (HOSPITAL_COMMUNITY)
Admission: EM | Admit: 2022-09-04 | Discharge: 2022-09-04 | Disposition: A | Discharge status: Home / Self Care | Payer: Medicaid Other | Attending: General Surgery | Admitting: General Surgery

## 2022-09-04 DIAGNOSIS — Z87891 Personal history of nicotine dependence: Secondary | ICD-10-CM | POA: Insufficient documentation

## 2022-09-04 DIAGNOSIS — I10 Essential (primary) hypertension: Secondary | ICD-10-CM | POA: Diagnosis not present

## 2022-09-04 DIAGNOSIS — K8000 Calculus of gallbladder with acute cholecystitis without obstruction: Secondary | ICD-10-CM

## 2022-09-04 DIAGNOSIS — K5730 Diverticulosis of large intestine without perforation or abscess without bleeding: Secondary | ICD-10-CM | POA: Insufficient documentation

## 2022-09-04 DIAGNOSIS — N83291 Other ovarian cyst, right side: Secondary | ICD-10-CM | POA: Diagnosis not present

## 2022-09-04 DIAGNOSIS — D252 Subserosal leiomyoma of uterus: Secondary | ICD-10-CM | POA: Insufficient documentation

## 2022-09-04 DIAGNOSIS — E860 Dehydration: Secondary | ICD-10-CM

## 2022-09-04 DIAGNOSIS — R1011 Right upper quadrant pain: Secondary | ICD-10-CM | POA: Diagnosis not present

## 2022-09-04 DIAGNOSIS — R1010 Upper abdominal pain, unspecified: Secondary | ICD-10-CM | POA: Diagnosis not present

## 2022-09-04 DIAGNOSIS — K8012 Calculus of gallbladder with acute and chronic cholecystitis without obstruction: Secondary | ICD-10-CM | POA: Insufficient documentation

## 2022-09-04 DIAGNOSIS — E876 Hypokalemia: Secondary | ICD-10-CM

## 2022-09-04 DIAGNOSIS — K760 Fatty (change of) liver, not elsewhere classified: Secondary | ICD-10-CM | POA: Insufficient documentation

## 2022-09-04 DIAGNOSIS — D270 Benign neoplasm of right ovary: Secondary | ICD-10-CM | POA: Diagnosis not present

## 2022-09-04 DIAGNOSIS — K819 Cholecystitis, unspecified: Secondary | ICD-10-CM | POA: Diagnosis not present

## 2022-09-04 DIAGNOSIS — K828 Other specified diseases of gallbladder: Secondary | ICD-10-CM | POA: Diagnosis not present

## 2022-09-04 DIAGNOSIS — K8020 Calculus of gallbladder without cholecystitis without obstruction: Secondary | ICD-10-CM | POA: Diagnosis not present

## 2022-09-04 DIAGNOSIS — R112 Nausea with vomiting, unspecified: Secondary | ICD-10-CM

## 2022-09-04 DIAGNOSIS — K810 Acute cholecystitis: Secondary | ICD-10-CM | POA: Diagnosis not present

## 2022-09-04 HISTORY — PX: CHOLECYSTECTOMY: SHX55

## 2022-09-04 HISTORY — DX: Type 2 diabetes mellitus without complications: E11.9

## 2022-09-04 LAB — COMPREHENSIVE METABOLIC PANEL
ALT: 16 U/L (ref 0–44)
AST: 15 U/L (ref 15–41)
Albumin: 3.6 g/dL (ref 3.5–5.0)
Alkaline Phosphatase: 66 U/L (ref 38–126)
Anion gap: 12 (ref 5–15)
BUN: 9 mg/dL (ref 6–20)
CO2: 21 mmol/L — ABNORMAL LOW (ref 22–32)
Calcium: 8.8 mg/dL — ABNORMAL LOW (ref 8.9–10.3)
Chloride: 102 mmol/L (ref 98–111)
Creatinine, Ser: 0.93 mg/dL (ref 0.44–1.00)
GFR, Estimated: >60 mL/min (ref >60)
Glucose, Bld: 133 mg/dL — ABNORMAL HIGH (ref 70–99)
Potassium: 3.4 mmol/L — ABNORMAL LOW (ref 3.5–5.1)
Sodium: 135 mmol/L (ref 135–145)
Total Bilirubin: 0.3 mg/dL (ref 0.3–1.2)
Total Protein: 7.8 g/dL (ref 6.5–8.1)

## 2022-09-04 LAB — GLUCOSE, CAPILLARY
Glucose-Capillary: 64 mg/dL — ABNORMAL LOW (ref 70–99)
Glucose-Capillary: 104 mg/dL — ABNORMAL HIGH (ref 70–99)
Glucose-Capillary: 83 mg/dL (ref 70–99)

## 2022-09-04 LAB — CBC
HCT: 33.4 % — ABNORMAL LOW (ref 36.0–46.0)
Hemoglobin: 10.5 g/dL — ABNORMAL LOW (ref 12.0–15.0)
MCH: 21.5 pg — ABNORMAL LOW (ref 26.0–34.0)
MCHC: 31.4 g/dL (ref 30.0–36.0)
MCV: 68.3 fL — ABNORMAL LOW (ref 80.0–100.0)
Platelets: 507 10*3/uL — ABNORMAL HIGH (ref 150–400)
RBC: 4.89 MIL/uL (ref 3.87–5.11)
RDW: 20.2 % — ABNORMAL HIGH (ref 11.5–15.5)
WBC: 17.4 10*3/uL — ABNORMAL HIGH (ref 4.0–10.5)
nRBC: 0 % (ref 0.0–0.2)

## 2022-09-04 LAB — URINALYSIS, ROUTINE W REFLEX MICROSCOPIC
Bilirubin Urine: NEGATIVE
Glucose, UA: NEGATIVE mg/dL
Hgb urine dipstick: NEGATIVE
Ketones, ur: NEGATIVE mg/dL
Leukocytes,Ua: NEGATIVE
Nitrite: NEGATIVE
Protein, ur: NEGATIVE mg/dL
Specific Gravity, Urine: 1.009 (ref 1.005–1.030)
pH: 7 (ref 5.0–8.0)

## 2022-09-04 LAB — HCG, SERUM, QUALITATIVE: Preg, Serum: NEGATIVE

## 2022-09-04 LAB — LIPASE, BLOOD: Lipase: 39 U/L (ref 11–51)

## 2022-09-04 SURGERY — LAPAROSCOPIC CHOLECYSTECTOMY
Anesthesia: General | Site: Abdomen

## 2022-09-04 MED ORDER — FENTANYL CITRATE (PF) 100 MCG/2ML IJ SOLN
INTRAMUSCULAR | Status: AC
  Filled 2022-09-04: qty 2

## 2022-09-04 MED ORDER — MIDAZOLAM HCL 2 MG/2ML IJ SOLN
INTRAMUSCULAR | Status: AC
  Filled 2022-09-04: qty 2

## 2022-09-04 MED ORDER — ROCURONIUM BROMIDE 10 MG/ML (PF) SYRINGE
PREFILLED_SYRINGE | INTRAVENOUS | Status: DC | PRN
  Administered 2022-09-04: 40 mg via INTRAVENOUS

## 2022-09-04 MED ORDER — PHENYLEPHRINE 80 MCG/ML (10ML) SYRINGE FOR IV PUSH (FOR BLOOD PRESSURE SUPPORT)
PREFILLED_SYRINGE | INTRAVENOUS | Status: AC
  Filled 2022-09-04: qty 10

## 2022-09-04 MED ORDER — OXYCODONE HCL 5 MG PO TABS
ORAL_TABLET | ORAL | Status: AC
  Filled 2022-09-04: qty 1

## 2022-09-04 MED ORDER — FENTANYL CITRATE (PF) 250 MCG/5ML IJ SOLN
INTRAMUSCULAR | Status: AC
  Filled 2022-09-04: qty 5

## 2022-09-04 MED ORDER — SODIUM CHLORIDE 0.9 % IR SOLN
Status: DC | PRN
  Administered 2022-09-04: 1000 mL

## 2022-09-04 MED ORDER — OXYCODONE HCL 5 MG PO TABS: 5.0000 mg | ORAL_TABLET | Freq: Four times a day (QID) | ORAL | 0 refills | Status: AC | PRN

## 2022-09-04 MED ORDER — MORPHINE SULFATE (PF) 2 MG/ML IV SOLN
2.0000 mg | Freq: Once | INTRAVENOUS | Status: AC
  Administered 2022-09-04: 2 mg via INTRAVENOUS
  Filled 2022-09-04: qty 1

## 2022-09-04 MED ORDER — SODIUM CHLORIDE 0.9 % IV SOLN
2.0000 g | Freq: Once | INTRAVENOUS | Status: AC
  Administered 2022-09-04: 2 g via INTRAVENOUS
  Filled 2022-09-04: qty 20

## 2022-09-04 MED ORDER — SUCCINYLCHOLINE CHLORIDE 200 MG/10ML IV SOSY
PREFILLED_SYRINGE | INTRAVENOUS | Status: AC
  Filled 2022-09-04: qty 10

## 2022-09-04 MED ORDER — LIDOCAINE 2% (20 MG/ML) 5 ML SYRINGE
INTRAMUSCULAR | Status: AC
  Filled 2022-09-04: qty 5

## 2022-09-04 MED ORDER — ACETAMINOPHEN 500 MG PO TABS: 1000.0000 mg | ORAL_TABLET | Freq: Once | ORAL | Status: AC

## 2022-09-04 MED ORDER — METRONIDAZOLE 500 MG/100ML IV SOLN
500.0000 mg | Freq: Once | INTRAVENOUS | Status: AC
  Administered 2022-09-04: 500 mg via INTRAVENOUS
  Filled 2022-09-04: qty 100

## 2022-09-04 MED ORDER — PROPOFOL 10 MG/ML IV BOLUS
INTRAVENOUS | Status: DC | PRN
  Administered 2022-09-04: 170 mg via INTRAVENOUS

## 2022-09-04 MED ORDER — SUGAMMADEX SODIUM 200 MG/2ML IV SOLN
INTRAVENOUS | Status: DC | PRN
  Administered 2022-09-04: 200 mg via INTRAVENOUS

## 2022-09-04 MED ORDER — ONDANSETRON HCL 4 MG/2ML IJ SOLN
INTRAMUSCULAR | Status: DC | PRN
  Administered 2022-09-04: 4 mg via INTRAVENOUS

## 2022-09-04 MED ORDER — AMISULPRIDE (ANTIEMETIC) 5 MG/2ML IV SOLN
INTRAVENOUS | Status: AC
  Filled 2022-09-04: qty 4

## 2022-09-04 MED ORDER — MORPHINE SULFATE (PF) 4 MG/ML IV SOLN
4.0000 mg | Freq: Once | INTRAVENOUS | Status: AC
  Administered 2022-09-04: 4 mg via INTRAVENOUS
  Filled 2022-09-04: qty 1

## 2022-09-04 MED ORDER — FENTANYL CITRATE (PF) 100 MCG/2ML IJ SOLN
25.0000 ug | INTRAMUSCULAR | Status: DC | PRN
  Administered 2022-09-04: 25 ug via INTRAVENOUS

## 2022-09-04 MED ORDER — ROCURONIUM BROMIDE 10 MG/ML (PF) SYRINGE
PREFILLED_SYRINGE | INTRAVENOUS | Status: AC
  Filled 2022-09-04: qty 10

## 2022-09-04 MED ORDER — BUPIVACAINE HCL (PF) 0.25 % IJ SOLN
INTRAMUSCULAR | Status: AC
  Filled 2022-09-04: qty 30

## 2022-09-04 MED ORDER — AMISULPRIDE (ANTIEMETIC) 5 MG/2ML IV SOLN
10.0000 mg | Freq: Once | INTRAVENOUS | Status: AC | PRN
  Administered 2022-09-04: 10 mg via INTRAVENOUS

## 2022-09-04 MED ORDER — DEXTROSE 50 % IV SOLN
INTRAVENOUS | Status: AC
  Filled 2022-09-04: qty 50

## 2022-09-04 MED ORDER — SODIUM CHLORIDE 0.9 % IV SOLN
12.5000 mg | Freq: Once | INTRAVENOUS | Status: DC
  Filled 2022-09-04: qty 0.5

## 2022-09-04 MED ORDER — ONDANSETRON HCL 4 MG/2ML IJ SOLN
INTRAMUSCULAR | Status: AC
  Filled 2022-09-04: qty 2

## 2022-09-04 MED ORDER — CHLORHEXIDINE GLUCONATE 0.12 % MT SOLN
OROMUCOSAL | Status: AC
  Administered 2022-09-04: 15 mL via OROMUCOSAL
  Filled 2022-09-04: qty 15

## 2022-09-04 MED ORDER — DEXTROSE 50 % IV SOLN
12.5000 g | INTRAVENOUS | Status: AC
  Administered 2022-09-04: 12.5 g via INTRAVENOUS

## 2022-09-04 MED ORDER — FENTANYL CITRATE (PF) 250 MCG/5ML IJ SOLN
INTRAMUSCULAR | Status: DC | PRN
  Administered 2022-09-04 (×2): 100 ug via INTRAVENOUS
  Administered 2022-09-04: 50 ug via INTRAVENOUS

## 2022-09-04 MED ORDER — MIDAZOLAM HCL 2 MG/2ML IJ SOLN
INTRAMUSCULAR | Status: DC | PRN
  Administered 2022-09-04: 2 mg via INTRAVENOUS

## 2022-09-04 MED ORDER — ACETAMINOPHEN 500 MG PO TABS
ORAL_TABLET | ORAL | Status: AC
  Administered 2022-09-04: 1000 mg via ORAL
  Filled 2022-09-04: qty 2

## 2022-09-04 MED ORDER — KETOROLAC TROMETHAMINE 15 MG/ML IJ SOLN
15.0000 mg | Freq: Once | INTRAMUSCULAR | Status: AC
  Administered 2022-09-04: 15 mg via INTRAVENOUS
  Filled 2022-09-04: qty 1

## 2022-09-04 MED ORDER — IOHEXOL 350 MG/ML SOLN
75.0000 mL | Freq: Once | INTRAVENOUS | Status: AC | PRN
  Administered 2022-09-04: 75 mL via INTRAVENOUS

## 2022-09-04 MED ORDER — 0.9 % SODIUM CHLORIDE (POUR BTL) OPTIME
TOPICAL | Status: DC | PRN
  Administered 2022-09-04: 1000 mL

## 2022-09-04 MED ORDER — LACTATED RINGERS IV BOLUS
1000.0000 mL | Freq: Once | INTRAVENOUS | Status: AC
  Administered 2022-09-04: 1000 mL via INTRAVENOUS

## 2022-09-04 MED ORDER — ONDANSETRON HCL 4 MG/2ML IJ SOLN: 4.0000 mg | Freq: Once | INTRAMUSCULAR | Status: DC | PRN

## 2022-09-04 MED ORDER — SCOPOLAMINE 1 MG/3DAYS TD PT72
MEDICATED_PATCH | TRANSDERMAL | Status: AC
  Administered 2022-09-04: 1.5 mg via TRANSDERMAL
  Filled 2022-09-04: qty 1

## 2022-09-04 MED ORDER — LACTATED RINGERS IV SOLN: INTRAVENOUS | Status: DC

## 2022-09-04 MED ORDER — SCOPOLAMINE 1 MG/3DAYS TD PT72: 1.0000 | MEDICATED_PATCH | TRANSDERMAL | Status: DC

## 2022-09-04 MED ORDER — ONDANSETRON HCL 4 MG/2ML IJ SOLN
4.0000 mg | Freq: Once | INTRAMUSCULAR | Status: AC
  Administered 2022-09-04: 4 mg via INTRAVENOUS
  Filled 2022-09-04: qty 2

## 2022-09-04 MED ORDER — BUPIVACAINE HCL 0.25 % IJ SOLN
INTRAMUSCULAR | Status: DC | PRN
  Administered 2022-09-04: 8 mL

## 2022-09-04 MED ORDER — CHLORHEXIDINE GLUCONATE 0.12 % MT SOLN: 15.0000 mL | Freq: Once | OROMUCOSAL | Status: AC

## 2022-09-04 MED ORDER — LIDOCAINE 2% (20 MG/ML) 5 ML SYRINGE
INTRAMUSCULAR | Status: DC | PRN
  Administered 2022-09-04: 40 mg via INTRAVENOUS

## 2022-09-04 MED ORDER — SUCCINYLCHOLINE CHLORIDE 200 MG/10ML IV SOSY
PREFILLED_SYRINGE | INTRAVENOUS | Status: DC | PRN
  Administered 2022-09-04: 100 mg via INTRAVENOUS

## 2022-09-04 MED ORDER — OXYCODONE HCL 5 MG PO TABS
5.0000 mg | ORAL_TABLET | Freq: Once | ORAL | Status: AC
  Administered 2022-09-04: 5 mg via ORAL

## 2022-09-04 MED ORDER — ORAL CARE MOUTH RINSE: 15.0000 mL | Freq: Once | OROMUCOSAL | Status: AC

## 2022-09-04 MED ORDER — SODIUM CHLORIDE 0.9 % IV SOLN: 1.0000 g | Freq: Once | INTRAVENOUS | Status: DC

## 2022-09-04 MED ORDER — HYDROMORPHONE HCL 1 MG/ML IJ SOLN
0.5000 mg | Freq: Once | INTRAMUSCULAR | Status: AC
  Administered 2022-09-04: 0.5 mg via INTRAVENOUS
  Filled 2022-09-04: qty 1

## 2022-09-04 SURGICAL SUPPLY — 42 items
ADH SKN CLS APL DERMABOND .7 (GAUZE/BANDAGES/DRESSINGS) ×1
APL PRP STRL LF DISP 70% ISPRP (MISCELLANEOUS) ×1
BAG COUNTER SPONGE SURGICOUNT (BAG) ×1 IMPLANT
BAG SPEC RTRVL 10 TROC 200 (ENDOMECHANICALS) ×1
BAG SPNG CNTER NS LX DISP (BAG) ×1
CANISTER SUCT 3000ML PPV (MISCELLANEOUS) ×1 IMPLANT
CHLORAPREP W/TINT 26 (MISCELLANEOUS) ×1 IMPLANT
CLIP LIGATING HEMO O LOK GREEN (MISCELLANEOUS) ×1 IMPLANT
COVER SURGICAL LIGHT HANDLE (MISCELLANEOUS) ×1 IMPLANT
COVER TRANSDUCER ULTRASND (DRAPES) ×1 IMPLANT
DERMABOND ADVANCED .7 DNX12 (GAUZE/BANDAGES/DRESSINGS) ×1 IMPLANT
ELECT REM PT RETURN 9FT ADLT (ELECTROSURGICAL) ×1
ELECTRODE REM PT RTRN 9FT ADLT (ELECTROSURGICAL) ×1 IMPLANT
GLOVE BIO SURGEON STRL SZ7.5 (GLOVE) ×2 IMPLANT
GOWN STRL REUS W/ TWL LRG LVL3 (GOWN DISPOSABLE) ×2 IMPLANT
GOWN STRL REUS W/ TWL XL LVL3 (GOWN DISPOSABLE) ×1 IMPLANT
GOWN STRL REUS W/TWL LRG LVL3 (GOWN DISPOSABLE) ×2
GOWN STRL REUS W/TWL XL LVL3 (GOWN DISPOSABLE) ×1
GRASPER SUT TROCAR 14GX15 (MISCELLANEOUS) ×1 IMPLANT
IRRIG SUCT STRYKERFLOW 2 WTIP (MISCELLANEOUS) ×1
IRRIGATION SUCT STRKRFLW 2 WTP (MISCELLANEOUS) ×1 IMPLANT
KIT BASIN OR (CUSTOM PROCEDURE TRAY) ×1 IMPLANT
KIT IMAGING PINPOINTPAQ (MISCELLANEOUS) IMPLANT
KIT TURNOVER KIT B (KITS) ×1 IMPLANT
NDL INSUFFLATION 14GA 120MM (NEEDLE) ×1 IMPLANT
NEEDLE INSUFFLATION 14GA 120MM (NEEDLE) ×1 IMPLANT
NS IRRIG 1000ML POUR BTL (IV SOLUTION) ×1 IMPLANT
PAD ARMBOARD 7.5X6 YLW CONV (MISCELLANEOUS) ×1 IMPLANT
POUCH LAPAROSCOPIC INSTRUMENT (MISCELLANEOUS) ×1 IMPLANT
POUCH RETRIEVAL ECOSAC 10 (ENDOMECHANICALS) IMPLANT
SCISSORS LAP 5X35 DISP (ENDOMECHANICALS) ×1 IMPLANT
SET TUBE SMOKE EVAC HIGH FLOW (TUBING) ×1 IMPLANT
SLEEVE Z-THREAD 5X100MM (TROCAR) ×1 IMPLANT
SPECIMEN JAR SMALL (MISCELLANEOUS) ×1 IMPLANT
SUT MNCRL AB 4-0 PS2 18 (SUTURE) ×1 IMPLANT
TOWEL GREEN STERILE (TOWEL DISPOSABLE) ×1 IMPLANT
TOWEL GREEN STERILE FF (TOWEL DISPOSABLE) ×1 IMPLANT
TRAY LAPAROSCOPIC MC (CUSTOM PROCEDURE TRAY) ×1 IMPLANT
TROCAR 11X100 Z THREAD (TROCAR) ×1 IMPLANT
TROCAR Z-THREAD OPTICAL 5X100M (TROCAR) ×1 IMPLANT
WARMER LAPAROSCOPE (MISCELLANEOUS) ×1 IMPLANT
WATER STERILE IRR 1000ML POUR (IV SOLUTION) ×1 IMPLANT

## 2022-09-04 NOTE — Discharge Instructions (Addendum)

## 2022-09-04 NOTE — ED Provider Notes (Signed)
[place] EMERGENCY DEPARTMENT AT [facility] Provider Note   CSN: [id number] Arrival date & time: 09/04/22  0117     History  Chief Complaint  Patient presents with   Abdominal Pain    OSIE EAST is a 42 y.o. female with PMH anxiety, HTN, T2DM, PCOS who presents to ED c/o severe RUQ abdominal pain, nausea, and vomiting that started in the last day. No fever, diarrhea, chest pain, dyspnea, cough, congestion, dysuria or hematuria. No history of these symptoms. No previous abdominal surgeries. Some lower back pain as well. No hematemesis, hematochezia, or melena. Last bowel movement today and normal for her.      Home Medications Prior to Admission medications   Medication Sig Start Date End Date Taking? Authorizing Provider  acetaminophen (TYLENOL) 325 MG tablet Take 650 mg by mouth every 6 (six) hours as needed for mild pain.     [provider]  famotidine (PEPCID) 20 MG tablet Take 1 tablet (20 mg total) by mouth 2 (two) times daily. 02/09/22   Wynetta Fines, MD  NIFEdipine (ADALAT CC) 60 MG 24 hr tablet Take 1 tablet (60 mg total) by mouth daily. 07/30/19   [name], MD  ondansetron (ZOFRAN) 4 MG tablet Take 1 tablet (4 mg total) by mouth every 6 (six) hours. 02/09/22   Wynetta Fines, MD  Prenatal Vit-Fe Fumarate-FA (PRENATAL MULTIVITAMIN) TABS tablet Take 1 tablet by mouth daily at 12 noon.    [provider]  sertraline (ZOLOFT) 25 MG tablet Take 25 mg by mouth daily.    [provider]      Allergies    Amoxicillin and Latex    Review of Systems   Review of Systems  All other systems reviewed and are negative.   Physical Exam Updated Vital Signs BP 115/71   Pulse 70   Temp 98.5 F (36.9 C) (Oral)   Resp 17   LMP 08/11/2022   SpO2 100%  Physical Exam Vitals and nursing note reviewed.  Constitutional:      General: She is not in acute distress.    Appearance: Normal appearance.  HENT:     Head: Normocephalic  and atraumatic.     Mouth/Throat:     Mouth: Mucous membranes are dry.  Eyes:     Conjunctiva/sclera: Conjunctivae normal.  Cardiovascular:     Rate and Rhythm: Normal rate and regular rhythm.     Heart sounds: No murmur heard. Pulmonary:     Effort: Pulmonary effort is normal.     Breath sounds: Normal breath sounds.  Abdominal:     General: Abdomen is flat.     Palpations: Abdomen is soft.     Tenderness: There is abdominal tenderness in the right upper quadrant and periumbilical area. There is no right CVA tenderness, left CVA tenderness, guarding or rebound. Negative signs include Murphy's sign and McBurney's sign.  Musculoskeletal:        General: Normal range of motion.     Cervical back: Neck supple.     Right lower leg: No edema.     Left lower leg: No edema.  Skin:    General: Skin is warm and dry.     Capillary Refill: Capillary refill takes less than 2 seconds.  Neurological:     Mental Status: She is alert. Mental status is at baseline.  Psychiatric:        Behavior: Behavior normal.     ED Results / Procedures / Treatments  Labs (all labs ordered are listed, but only abnormal results are displayed) Labs Reviewed  COMPREHENSIVE METABOLIC PANEL - Abnormal; Notable for the following components:      Result Value   Potassium 3.4 (*)    CO2 21 (*)    Glucose, Bld 133 (*)    Calcium 8.8 (*)    All other components within normal limits  CBC - Abnormal; Notable for the following components:   WBC 17.4 (*)    Hemoglobin 10.5 (*)    HCT 33.4 (*)    MCV 68.3 (*)    MCH 21.5 (*)    RDW 20.2 (*)    Platelets 507 (*)    All other components within normal limits  URINALYSIS, ROUTINE W REFLEX MICROSCOPIC - Abnormal; Notable for the following components:   Color, Urine STRAW (*)    All other components within normal limits  LIPASE, BLOOD  HCG, SERUM, QUALITATIVE    EKG None  Radiology US Abdomen Limited RUQ (LIVER/GB)  Result Date: 09/04/2022 CLINICAL DATA:   Gallbladder dilatation on today's CT with right upper quadrant pain. [id number]. EXAM: ULTRASOUND ABDOMEN LIMITED RIGHT UPPER QUADRANT COMPARISON:  CT with IV contrast earlier today. FINDINGS: Gallbladder: There are few small clustered layering stones in the gallbladder. There is gallbladder dilatation up 12 cm in length but no free wall thickening, positive sonographic Murphy sign, or pericholecystic fluid. Common bile duct: Diameter: 3.1 mm.  No intrahepatic biliary dilatation. Liver: No focal lesion identified. There is mild increased hepatic echogenicity compatible with mild steatosis. Portal vein is patent on color Doppler imaging with normal direction of blood flow towards the liver. Other: None. IMPRESSION: 1. Cholelithiasis with gallbladder dilatation, without evidence of acute cholecystitis. 2. Mild hepatic steatosis. Electronically Signed   By: Almira Bar M.D.   On: 09/04/2022 06:28   CT ABDOMEN PELVIS W CONTRAST  Result Date: 09/04/2022 CLINICAL DATA:  42 year old female with a history of right abdominal pain. Right ovarian dermoid. EXAM: CT ABDOMEN AND PELVIS WITH CONTRAST TECHNIQUE: Multidetector CT imaging of the abdomen and pelvis was performed using the standard protocol following bolus administration of intravenous contrast. RADIATION DOSE REDUCTION: This exam was performed according to the departmental dose-optimization program which includes automated exposure control, adjustment of the mA and/or kV according to patient size and/or use of iterative reconstruction technique. CONTRAST:  75mL OMNIPAQUE IOHEXOL 350 MG/ML SOLN COMPARISON:  CT Abdomen and Pelvis 188416 and earlier. FINDINGS: Lower chest: Negative. Hepatobiliary: Distended gallbladder is new. No definite cholelithiasis or pericholecystic inflammation. No bile duct enlargement. Negative liver otherwise. Pancreas: Negative. Spleen: Negative. Adrenals/Urinary Tract: Normal adrenal glands. Symmetric, negative kidneys. Symmetric renal  enhancement, no delayed excretory images. No nephrolithiasis. Decompressed ureters. Numerous pelvic phleboliths but unremarkable bladder. Stomach/Bowel: Negative rectum. Diverticulosis of the descending and sigmoid colon with no active inflammation. Mild upstream retained stool. Mildly redundant right colon. Normal appendix on series 3, image 55. Decompressed terminal ileum. No dilated small bowel. Diminutive stomach. Decompressed duodenum. No free air, free fluid or mesenteric inflammation. Vascular/Lymphatic: Suboptimal intravascular contrast bolus but the major arterial structures and portal venous system appear to be patent. Normal caliber abdominal aorta. No lymphadenopathy. Reproductive: Complex right ovarian dermoid with bilobed appearance, nearly 10 cm long axis (series 3, image 67), multi cystic areas plus macroscopic fat and calcification/bone density. Left ovary is within normal limits. There are small exophytic uterine fundal fibroids seen on series 3, image 73, 4.3 cm. No regional inflammation identified. Other: Trace if any pelvis free fluid. Musculoskeletal:  Chronic disc degeneration and vacuum disc at the lumbosacral junction. Lower lumbar facet hypertrophy. No acute osseous abnormality identified. IMPRESSION: 1. Distended gallbladder is new from prior CT. No definite cholelithiasis or inflammation by CT. If there is right upper quadrant pain then Ultrasound would be valuable. 2. 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 Smaller subserosal uterine fibroids. 3. Normal appendix. Distal colon diverticular disease with no active inflammation. Electronically Signed   By: Odessa Fleming M.D.   On: 09/04/2022 04:54    Procedures Procedures    Medications Ordered in ED Medications  promethazine (PHENERGAN) 12.5 mg in sodium chloride 0.9 % 50 mL IVPB (has no administration in time range)  metroNIDAZOLE (FLAGYL) IVPB 500 mg (has no administration in time range)   cefTRIAXone (ROCEPHIN) 2 g in sodium chloride 0.9 % 100 mL IVPB (has no administration in time range)  morphine (PF) 4 MG/ML injection 4 mg (4 mg Intravenous Given 09/04/22 0352)  ondansetron (ZOFRAN) injection 4 mg (4 mg Intravenous Given 09/04/22 0350)  lactated ringers bolus 1,000 mL (0 mLs Intravenous Stopped 09/04/22 0631)  iohexol (OMNIPAQUE) 350 MG/ML injection 75 mL (75 mLs Intravenous Contrast Given 09/04/22 0425)  morphine (PF) 2 MG/ML injection 2 mg (2 mg Intravenous Given 09/04/22 0519)  ondansetron (ZOFRAN) injection 4 mg (4 mg Intravenous Given 09/04/22 0522)  ketorolac (TORADOL) 15 MG/ML injection 15 mg (15 mg Intravenous Given 09/04/22 0628)  HYDROmorphone (DILAUDID) injection 0.5 mg (0.5 mg Intravenous Given 09/04/22 7425)    ED Course/ Medical Decision Making/ A&P Clinical Course as of 09/04/22 0704  Thu Sep 04, 2022  9563 Questionable gallbladder distention on CT scan.  Normal bili, normal LFTs.  Does have a leukocytosis.  Patient afebrile.  Still complaining of right upper quadrant pain.  No definitive cholelithiasis or inflammatory changes otherwise.  Will order ultrasound for further assessment. [MG]  [id number] Positive Murphy's sign by attending on re-exam. Remains with RUQ tenderness to palpation, poorly controlled pain and nausea despite redosing medication. Will consult surgery.  [MG]  [phone] Patient updated on plans to be seen by surgery.  Still complaining of persistent pain.  IV antibiotics ordered. [MG]    Clinical Course User Index [MG] Tonette Lederer, PA-C                                 Medical Decision Making Amount and/or Complexity of Data Reviewed Labs: ordered. Decision-making details documented in ED Course. Radiology: ordered. Decision-making details documented in ED Course.  Risk Prescription drug management. Decision regarding hospitalization.   Medical Decision Making:   CASYN HEES is a 42 y.o. female who presented to the ED today with abdominal  pain detailed above.    Patient's presentation is complicated by their history of PCOS, HTN, T2DM.  Complete initial physical exam performed, notably the patient  was in NAD, nontoxic appearing. She had RUQ and periumbilical abdominal tenderness. Abdomen soft, nondistended. No CVA tenderness.    Reviewed and confirmed nursing documentation for past medical history, family history, social history.    Initial Assessment:   With the patient's presentation of abdominal pain, differential diagnosis includes but is not limited to AAA, mesenteric ischemia, appendicitis, diverticulitis, DKA, gastritis, gastroenteritis, AMI, nephrolithiasis, pancreatitis, peritonitis, adrenal insufficiency, intestinal ischemia, constipation, UTI, SBO/LBO, splenic rupture, biliary disease, IBD, IBS, PUD, hepatitis, STD, ovarian/testicular torsion, electrolyte disturbance, DKA, dehydration, acute kidney injury, renal failure, cholecystitis, cholelithiasis, choledocholithiasis, abdominal  pain of  unknown etiology, pregnancy, incomplete abortion, septic abortion, threatened abortion, ectopic pregnancy, PID.   Initial Plan:  Screening labs including CBC and Metabolic panel to evaluate for infectious or metabolic etiology of disease.  Lipase to evaluate for pancreatitis Urinalysis with reflex culture ordered to evaluate for UTI or relevant urologic/nephrologic pathology.  CT abd/pelvis to evaluate for intra-abdominal pathology Symptomatic management Objective evaluation as reviewed   Initial Study Results:   Laboratory  All laboratory results reviewed without evidence of clinically relevant pathology.   Exceptions include: K3.4, WBC 17.4, hemoglobin 10.5, platelets 507  Radiology:  All images reviewed independently. Agree with radiology report at this time.   US Abdomen Limited RUQ (LIVER/GB)  Result Date: 09/04/2022 CLINICAL DATA:  Gallbladder dilatation on today's CT with right upper quadrant pain. [id number]. EXAM:  ULTRASOUND ABDOMEN LIMITED RIGHT UPPER QUADRANT COMPARISON:  CT with IV contrast earlier today. FINDINGS: Gallbladder: There are few small clustered layering stones in the gallbladder. There is gallbladder dilatation up 12 cm in length but no free wall thickening, positive sonographic Murphy sign, or pericholecystic fluid. Common bile duct: Diameter: 3.1 mm.  No intrahepatic biliary dilatation. Liver: No focal lesion identified. There is mild increased hepatic echogenicity compatible with mild steatosis. Portal vein is patent on color Doppler imaging with normal direction of blood flow towards the liver. Other: None. IMPRESSION: 1. Cholelithiasis with gallbladder dilatation, without evidence of acute cholecystitis. 2. Mild hepatic steatosis. Electronically Signed   By: Almira Bar M.D.   On: 09/04/2022 06:28   CT ABDOMEN PELVIS W CONTRAST  Result Date: 09/04/2022 CLINICAL DATA:  42 year old female with a history of right abdominal pain. Right ovarian dermoid. EXAM: CT ABDOMEN AND PELVIS WITH CONTRAST TECHNIQUE: Multidetector CT imaging of the abdomen and pelvis was performed using the standard protocol following bolus administration of intravenous contrast. RADIATION DOSE REDUCTION: This exam was performed according to the departmental dose-optimization program which includes automated exposure control, adjustment of the mA and/or kV according to patient size and/or use of iterative reconstruction technique. CONTRAST:  75mL OMNIPAQUE IOHEXOL 350 MG/ML SOLN COMPARISON:  CT Abdomen and Pelvis [id number] and earlier. FINDINGS: Lower chest: Negative. Hepatobiliary: Distended gallbladder is new. No definite cholelithiasis or pericholecystic inflammation. No bile duct enlargement. Negative liver otherwise. Pancreas: Negative. Spleen: Negative. Adrenals/Urinary Tract: Normal adrenal glands. Symmetric, negative kidneys. Symmetric renal enhancement, no delayed excretory images. No nephrolithiasis. Decompressed ureters.  Numerous pelvic phleboliths but unremarkable bladder. Stomach/Bowel: Negative rectum. Diverticulosis of the descending and sigmoid colon with no active inflammation. Mild upstream retained stool. Mildly redundant right colon. Normal appendix on series 3, image 55. Decompressed terminal ileum. No dilated small bowel. Diminutive stomach. Decompressed duodenum. No free air, free fluid or mesenteric inflammation. Vascular/Lymphatic: Suboptimal intravascular contrast bolus but the major arterial structures and portal venous system appear to be patent. Normal caliber abdominal aorta. No lymphadenopathy. Reproductive: Complex right ovarian dermoid with bilobed appearance, nearly 10 cm long axis (series 3, image 67), multi cystic areas plus macroscopic fat and calcification/bone density. Left ovary is within normal limits. There are small exophytic uterine fundal fibroids seen on series 3, image 73, 4.3 cm. No regional inflammation identified. Other: Trace if any pelvis free fluid. Musculoskeletal: Chronic disc degeneration and vacuum disc at the lumbosacral junction. Lower lumbar facet hypertrophy. No acute osseous abnormality identified. IMPRESSION: 1. Distended gallbladder is new from prior CT. No definite cholelithiasis or inflammation by CT. If there is right upper quadrant pain then Ultrasound would be valuable. 2. Complex right ovarian  dermoid (Mature Cystic Ovarian Teratoma) is nearly 10 cm. Smaller subserosal uterine fibroids. 3. Normal appendix. Distal colon diverticular disease with no active inflammation. Electronically Signed   By: Odessa Fleming M.D.   On: 09/04/2022 04:54      Consults: Case discussed with Dr. Cliffton Asters with surgery who will come see.   Final Assessment and Plan:   42 year old female presents to the ED with right upper quadrant abdominal pain, nausea, and vomiting.  She does appear uncomfortable.  On exam, she has tenderness to the right upper quadrant and periumbilical tenderness.  Negative  Murphy sign.  Afebrile, nontoxic-appearing, does not meet SIRS criteria.  Labs initiated from triage reveal a leukocytosis of 17.4.  Normal bilirubin, LFTs.  Urine does not appear acutely infected.  No McBurney's point tenderness.  She does express severe pain and has had difficult to control vomiting despite receiving medications in the ED, discussed risk/benefits of obtaining imaging with patient and she is agreeable to proceed.  No dysuria, hematuria, vaginal bleeding or discharge.  Low suspicion for pelvic pathology.  CT shows gallbladder distention which is new from previous scan.  We did not see any inflammatory stranding, cholelithiasis, or obstruction however.  Recommendation for follow-up right upper quadrant ultrasound.  Discussed plan to order this with patient who was agreeable.  She did require redosing of pain and nausea medication multiple times. Discussed with surgery who will evaluate with high suspicion for acute cholecystitis. Started on IV Rocephin and Flagyl. Remains stable.   Surgery evaluated pt, will admit.    Clinical Impression:  1. Cholelithiasis and acute cholecystitis without obstruction   2. Hypokalemia   3. Right upper quadrant abdominal pain   4. Nausea and vomiting, unspecified vomiting type   5. Dehydration      Admit           Final Clinical Impression(s) / ED Diagnoses Final diagnoses:  Hypokalemia  Right upper quadrant abdominal pain  Nausea and vomiting, unspecified vomiting type  Dehydration  Cholelithiasis and acute cholecystitis without obstruction    Rx / DC Orders ED Discharge Orders     None         Tonette Lederer, PA-C 09/04/22 0705    Glendora Score, MD 09/04/22 1911

## 2022-09-04 NOTE — Transfer of Care (Signed)
Immediate Anesthesia Transfer of Care Note  Patient: Dawn Friedman  Procedure(s) Performed: LAPAROSCOPIC CHOLECYSTECTOMY (Abdomen)  Patient Location: PACU  Anesthesia Type:General  Level of Consciousness: awake, drowsy, patient cooperative, and responds to stimulation  Airway & Oxygen Therapy: Patient Spontanous Breathing and Patient connected to nasal cannula oxygen  Post-op Assessment: Report given to RN and Post -op Vital signs reviewed and stable  Post vital signs: Reviewed and stable  Last Vitals:  Vitals Value Taken Time  BP    Temp    Pulse 95 09/04/22 1234  Resp 12 09/04/22 1234  SpO2 97 % 09/04/22 1234  Vitals shown include unfiled device data.  Last Pain:  Vitals:   09/04/22 1114  TempSrc:   PainSc: 0-No pain         Complications: No notable events documented.

## 2022-09-04 NOTE — Anesthesia Procedure Notes (Signed)
Procedure Name: Intubation Date/Time: 09/04/2022 11:53 AM  Performed by: Ayesha Rumpf, CRNAPre-anesthesia Checklist: Patient identified, Emergency Drugs available, Suction available and Patient being monitored Patient Re-evaluated:Patient Re-evaluated prior to induction Oxygen Delivery Method: Circle System Utilized Preoxygenation: Pre-oxygenation with 100% oxygen Induction Type: IV induction Ventilation: Mask ventilation without difficulty Laryngoscope Size: Mac and 4 Grade View: Grade II Tube type: Oral Tube size: 7.0 mm Number of attempts: 1 Airway Equipment and Method: Stylet and Oral airway Placement Confirmation: ETT inserted through vocal cords under direct vision, positive ETCO2 and breath sounds checked- equal and bilateral Secured at: 22 cm Tube secured with: Tape Dental Injury: Teeth and Oropharynx as per pre-operative assessment  Comments: Mac 3 unavailable.

## 2022-09-04 NOTE — Anesthesia Preprocedure Evaluation (Addendum)
Anesthesia Evaluation  Patient identified by MRN, date of birth, ID band Patient awake    Reviewed: Allergy & Precautions, NPO status , Patient's Chart, lab work & pertinent test results  Airway Mallampati: II  TM Distance: >3 FB Neck ROM: Full    Dental  (+) Teeth Intact, Dental Advisory Given   Pulmonary former smoker   Pulmonary exam normal breath sounds clear to auscultation       Cardiovascular hypertension, Pt. on medications Normal cardiovascular exam Rhythm:Regular Rate:Normal     Neuro/Psych  PSYCHIATRIC DISORDERS Anxiety     negative neurological ROS     GI/Hepatic ,GERD  Medicated,,Cholecystitis   Endo/Other  diabetes    Renal/GU negative Renal ROS     Musculoskeletal negative musculoskeletal ROS (+)    Abdominal   Peds  Hematology  (+) Blood dyscrasia, Sickle cell trait   Anesthesia Other Findings   Reproductive/Obstetrics                             Anesthesia Physical Anesthesia Plan  ASA: 2  Anesthesia Plan: General   Post-op Pain Management: Tylenol PO (pre-op)*   Induction: Intravenous  PONV Risk Score and Plan: 4 or greater and Scopolamine patch - Pre-op, Midazolam, Dexamethasone and Ondansetron  Airway Management Planned: Oral ETT  Additional Equipment:   Intra-op Plan:   Post-operative Plan: Extubation in OR  Informed Consent: I have reviewed the patients History and Physical, chart, labs and discussed the procedure including the risks, benefits and alternatives for the proposed anesthesia with the patient or authorized representative who has indicated his/her understanding and acceptance.     Dental advisory given  Plan Discussed with: CRNA  Anesthesia Plan Comments:        Anesthesia Quick Evaluation

## 2022-09-04 NOTE — Progress Notes (Signed)
Hypoglycemic Event  CBG: 64 @ 1113  Treatment: D50 25 mL (12.5 gm)  Symptoms: None  Follow-up CBG: Time: 1130 CBG Result:104  Possible Reasons for Event: Inadequate meal intake  Comments/MD notified:Yes, no new orders    Lily Peer, BSN

## 2022-09-04 NOTE — ED Triage Notes (Signed)
Patient reports RUQ pain with emesis this evening , no diarrhea or fever . She adds low back pain /no injury .

## 2022-09-04 NOTE — Op Note (Signed)
09/04/2022  12:24 PM  PATIENT:  Dawn Friedman  42 y.o. female  PRE-OPERATIVE DIAGNOSIS:  Cholecystitis  POST-OPERATIVE DIAGNOSIS:  Cholecystitis  PROCEDURE:  Procedure(s): LAPAROSCOPIC CHOLECYSTECTOMY (N/A)  SURGEON:  Surgeons and Role:    Axel Filler, MD - Primary  ANESTHESIA:   local and general  EBL:  minimal   BLOOD ADMINISTERED:none  DRAINS: none   LOCAL MEDICATIONS USED:  BUPIVICAINE   SPECIMEN:  Source of Specimen:  gallbladder  DISPOSITION OF SPECIMEN:  PATHOLOGY  COUNTS:  YES  TOURNIQUET:  * No tourniquets in log *  DICTATION: .Dragon Dictation   EBL: <5cc   Complications: none   Counts: reported as correct x 2   Findings:chronically inflamed gallbladder and white bile  Indications for procedure: Pt is a 41 with RUQ pain and seen to have gallstones and acute cholecystitis  Details of the procedure: The patient was taken to the operating and placed in the supine position with bilateral SCDs in place. A time out was called and all facts were verified. A pneumoperitoneum was obtained via A Veress needle technique to a pressure of 14mm of mercury. A 5mm trochar was then placed in the right upper quadrant under visualization, and there were no injuries to any abdominal organs. A 11 mm port was then placed in the umbilical region after infiltrating with local anesthesia under direct visualization. A second epigastric port was placed under direct visualization.   The gallbladder was identified and retracted, the peritoneum was then sharply dissected from the gallbladder and this dissection was carried down to Calot's triangle. The cystic duct was identified and dissected circumferentially and seen going into the gallbladder 360.  The cystic artery was dissected away from the surrounding tissues.   The critical angle was obtained.  2 clips were placed proximally one distally and the cystic duct transected. The cystic artery was identified and 2 clips  placed proximally and one distally and transected. We then proceeded to remove the gallbladder off the hepatic fossa with Bovie cautery. A retrieval bag was then placed in the abdomen and gallbladder placed in the bag. The hepatic fossa was then reexamined and hemostasis was achieved with Bovie cautery and was excellent at this portion of the case. The subhepatic fossa and perihepatic fossa was then irrigated until the effluent was clear. The specimen bag and specimen were removed from the abdominal cavity.  The 11 mm trocar fascia was reapproximated with the Endo Close #1 Vicryl x2. The pneumoperitoneum was evacuated and all trochars removed under direct visulalization. The skin was then closed with 4-0 Monocryl and the skin dressed with Dermabond. The patient was awaken from general anesthesia and taken to the recovery room in stable condition.   PLAN OF CARE: Discharge to home after PACU  PATIENT DISPOSITION:  PACU - hemodynamically stable.   Delay start of Pharmacological VTE agent (>24hrs) due to surgical blood loss or risk of bleeding: not applicable

## 2022-09-04 NOTE — H&P (Signed)
CC: RUQ pain  HPI: Dawn Friedman is an 42 y.o. female with hx of HTN presented to ER with RUQ pain that began ~9pm 8/21. Pain is sharp and unremitting, has gotten worse over the past 10 hrs. No radiation. No aggrav/allev factors. Never had this pain before. No clear trigger she can recall. No sick contacts. No fever/chills.   PSH: denies any prior surgical history  Past Medical History:  Diagnosis Date   Anxiety    Hematuria    HSV infection    Hypertension    PCOS (polycystic ovarian syndrome)    Sickle cell trait (HCC)     Past Surgical History:  Procedure Laterality Date   FINGER SURGERY     INDUCED ABORTION      Family History  Problem Relation Age of Onset   Cancer Mother    Cancer Father    Heart disease Father    COPD Father    Stroke Father    Stroke Brother     Social:  reports that she has quit smoking. She has never used smokeless tobacco. She reports that she does not currently use alcohol. She reports that she does not use drugs.  Allergies:  Allergies  Allergen Reactions   Amoxicillin Rash and Other (See Comments)    Has patient had a PCN reaction causing immediate rash, facial/tongue/throat swelling, SOB or lightheadedness with hypotension: No  Has patient had a PCN reaction causing severe rash involving mucus membranes or skin necrosis: No  Has patient had a PCN reaction that required hospitalization No  Has patient had a PCN reaction occurring within the last 10 years: No  If all of the above answers are "NO", then may proceed with Cephalosporin use.   Latex Other (See Comments) and Dermatitis    Irritation and dries her skin out    Medications: I have reviewed the patient's current medications.  Results for orders placed or performed during the hospital encounter of 09/04/22 (from the past 48 hour(s))  Urinalysis, Routine w reflex microscopic -Urine, Clean Catch     Status: Abnormal   Collection Time: 09/04/22  1:30 AM  Result Value  Ref Range   Color, Urine STRAW (A) YELLOW   APPearance CLEAR CLEAR   Specific Gravity, Urine 1.009 1.005 - 1.030   pH 7.0 5.0 - 8.0   Glucose, UA NEGATIVE NEGATIVE mg/dL   Hgb urine dipstick NEGATIVE NEGATIVE   Bilirubin Urine NEGATIVE NEGATIVE   Ketones, ur NEGATIVE NEGATIVE mg/dL   Protein, ur NEGATIVE NEGATIVE mg/dL   Nitrite NEGATIVE NEGATIVE   Leukocytes,Ua NEGATIVE NEGATIVE    Comment: Performed at [facility], [address] [place], [place] [address]  Lipase, blood     Status: None   Collection Time: 09/04/22  1:31 AM  Result Value Ref Range   Lipase 39 11 - 51 U/L    Comment: Performed at [facility], [address], [place], [place] [address]  Comprehensive metabolic panel     Status: Abnormal   Collection Time: 09/04/22  1:31 AM  Result Value Ref Range   Sodium 135 135 - 145 mmol/L   Potassium 3.4 (L) 3.5 - 5.1 mmol/L   Chloride 102 98 - 111 mmol/L   CO2 21 (L) 22 - 32 mmol/L   Glucose, Bld 133 (H) 70 - 99 mg/dL    Comment: Glucose reference range applies only to samples taken after fasting for at least 8 hours.   BUN 9 6 -  20 mg/dL   Creatinine, Ser 4.09 0.44 - 1.00 mg/dL   Calcium 8.8 (L) 8.9 - 10.3 mg/dL   Total Protein 7.8 6.5 - 8.1 g/dL   Albumin 3.6 3.5 - 5.0 g/dL   AST 15 15 - 41 U/L   ALT 16 0 - 44 U/L   Alkaline Phosphatase 66 38 - 126 U/L   Total Bilirubin 0.3 0.3 - 1.2 mg/dL   GFR, Estimated >81 >19 mL/min    Comment: (NOTE) Calculated using the CKD-EPI Creatinine Equation (2021)    Anion gap 12 5 - 15    Comment: Performed at [facility], [address] [place], [place] [address]  CBC     Status: Abnormal   Collection Time: 09/04/22  1:31 AM  Result Value Ref Range   WBC 17.4 (H) 4.0 - 10.5 K/uL   RBC 4.89 3.87 - 5.11 MIL/uL   Hemoglobin 10.5 (L) 12.0 - 15.0 g/dL   HCT 95.6 (L) 21.3 - 08.6 %   MCV 68.3 (L) 80.0 - 100.0 fL   MCH 21.5 (L) 26.0 - 34.0 pg   MCHC 31.4 30.0 - 36.0 g/dL   RDW 57.8 (H) 46.9 - 62.9 %    Platelets 507 (H) 150 - 400 K/uL    Comment: REPEATED TO VERIFY   nRBC 0.0 0.0 - 0.2 %    Comment: Performed at [facility], [address], [place], [place] [address]  hCG, serum, qualitative     Status: None   Collection Time: 09/04/22  1:31 AM  Result Value Ref Range   Preg, Serum NEGATIVE NEGATIVE    Comment:        THE SENSITIVITY OF THIS METHODOLOGY IS >10 mIU/mL. Performed at [facility], [address] [place], [place] [address]     US Abdomen Limited RUQ (LIVER/GB)  Result Date: 09/04/2022 CLINICAL DATA:  Gallbladder dilatation on today's CT with right upper quadrant pain. 102725. EXAM: ULTRASOUND ABDOMEN LIMITED RIGHT UPPER QUADRANT COMPARISON:  CT with IV contrast earlier today. FINDINGS: Gallbladder: There are few small clustered layering stones in the gallbladder. There is gallbladder dilatation up 12 cm in length but no free wall thickening, positive sonographic Murphy sign, or pericholecystic fluid. Common bile duct: Diameter: 3.1 mm.  No intrahepatic biliary dilatation. Liver: No focal lesion identified. There is mild increased hepatic echogenicity compatible with mild steatosis. Portal vein is patent on color Doppler imaging with normal direction of blood flow towards the liver. Other: None. IMPRESSION: 1. Cholelithiasis with gallbladder dilatation, without evidence of acute cholecystitis. 2. Mild hepatic steatosis. Electronically Signed   By: Almira Bar M.D.   On: 09/04/2022 06:28   CT ABDOMEN PELVIS W CONTRAST  Result Date: 09/04/2022 CLINICAL DATA:  42 year old female with a history of right abdominal pain. Right ovarian dermoid. EXAM: CT ABDOMEN AND PELVIS WITH CONTRAST TECHNIQUE: Multidetector CT imaging of the abdomen and pelvis was performed using the standard protocol following bolus administration of intravenous contrast. RADIATION DOSE REDUCTION: This exam was performed according to the departmental dose-optimization program which includes automated  exposure control, adjustment of the mA and/or kV according to patient size and/or use of iterative reconstruction technique. CONTRAST:  75mL OMNIPAQUE IOHEXOL 350 MG/ML SOLN COMPARISON:  CT Abdomen and Pelvis [id number] and earlier. FINDINGS: Lower chest: Negative. Hepatobiliary: Distended gallbladder is new. No definite cholelithiasis or pericholecystic inflammation. No bile duct enlargement. Negative liver otherwise. Pancreas: Negative. Spleen: Negative. Adrenals/Urinary Tract: Normal adrenal glands. Symmetric, negative kidneys. Symmetric  renal enhancement, no delayed excretory images. No nephrolithiasis. Decompressed ureters. Numerous pelvic phleboliths but unremarkable bladder. Stomach/Bowel: Negative rectum. Diverticulosis of the descending and sigmoid colon with no active inflammation. Mild upstream retained stool. Mildly redundant right colon. Normal appendix on series 3, image 55. Decompressed terminal ileum. No dilated small bowel. Diminutive stomach. Decompressed duodenum. No free air, free fluid or mesenteric inflammation. Vascular/Lymphatic: Suboptimal intravascular contrast bolus but the major arterial structures and portal venous system appear to be patent. Normal caliber abdominal aorta. No lymphadenopathy. Reproductive: Complex right ovarian dermoid with bilobed appearance, nearly 10 cm long axis (series 3, image 67), multi cystic areas plus macroscopic fat and calcification/bone density. Left ovary is within normal limits. There are small exophytic uterine fundal fibroids seen on series 3, image 73, 4.3 cm. No regional inflammation identified. Other: Trace if any pelvis free fluid. Musculoskeletal: Chronic disc degeneration and vacuum disc at the lumbosacral junction. Lower lumbar facet hypertrophy. No acute osseous abnormality identified. IMPRESSION: 1. Distended gallbladder is new from prior CT. No definite cholelithiasis or inflammation by CT. If there is right upper quadrant pain then Ultrasound  would be valuable. 2. 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 Smaller subserosal uterine fibroids. 3. Normal appendix. Distal colon diverticular disease with no active inflammation. Electronically Signed   By: Odessa Fleming M.D.   On: 09/04/2022 04:54    ROS - all of the below systems have been reviewed with the patient and positives are indicated with bold text General: chills, fever or night sweats Eyes: blurry vision or double vision ENT: epistaxis or sore throat Allergy/Immunology: itchy/watery eyes or nasal congestion Hematologic/Lymphatic: bleeding problems, blood clots or swollen lymph nodes Endocrine: temperature intolerance or unexpected weight changes Breast: new or changing breast lumps or nipple discharge Resp: cough, shortness of breath, or wheezing CV: chest pain or dyspnea on exertion GI: as per HPI GU: dysuria, trouble voiding, or hematuria MSK: joint pain or joint stiffness Neuro: TIA or stroke symptoms Derm: pruritus and skin lesion changes Psych: anxiety and depression  PE Blood pressure 120/77, pulse 72, temperature 98.5 F (36.9 C), temperature source Oral, resp. rate (!) 21, last menstrual period 08/11/2022, SpO2 93%, unknown if currently breastfeeding. Constitutional: NAD; conversant Eyes: Moist conjunctiva Lungs: Normal respiratory effort CV: RRR GI: Abd soft focally ttp in RUQ; no rebound nor guarding MSK: Normal range of motion of extremities Psychiatric: Appropriate affect  Results for orders placed or performed during the hospital encounter of 09/04/22 (from the past 48 hour(s))  Urinalysis, Routine w reflex microscopic -Urine, Clean Catch     Status: Abnormal   Collection Time: 09/04/22  1:30 AM  Result Value Ref Range   Color, Urine STRAW (A) YELLOW   APPearance CLEAR CLEAR   Specific Gravity, Urine 1.009 1.005 - 1.030   pH 7.0 5.0 - 8.0   Glucose, UA NEGATIVE NEGATIVE mg/dL   Hgb urine dipstick NEGATIVE NEGATIVE    Bilirubin Urine NEGATIVE NEGATIVE   Ketones, ur NEGATIVE NEGATIVE mg/dL   Protein, ur NEGATIVE NEGATIVE mg/dL   Nitrite NEGATIVE NEGATIVE   Leukocytes,Ua NEGATIVE NEGATIVE    Comment: Performed at [facility], [address] [place], [place] [address]  Lipase, blood     Status: None   Collection Time: 09/04/22  1:31 AM  Result Value Ref Range   Lipase 39 11 - 51 U/L    Comment: Performed at [facility] [place] [facility], [address], [place], [place] [address]  Comprehensive  metabolic panel     Status: Abnormal   Collection Time: 09/04/22  1:31 AM  Result Value Ref Range   Sodium 135 135 - 145 mmol/L   Potassium 3.4 (L) 3.5 - 5.1 mmol/L   Chloride 102 98 - 111 mmol/L   CO2 21 (L) 22 - 32 mmol/L   Glucose, Bld 133 (H) 70 - 99 mg/dL    Comment: Glucose reference range applies only to samples taken after fasting for at least 8 hours.   BUN 9 6 - 20 mg/dL   Creatinine, Ser 4.09 0.44 - 1.00 mg/dL   Calcium 8.8 (L) 8.9 - 10.3 mg/dL   Total Protein 7.8 6.5 - 8.1 g/dL   Albumin 3.6 3.5 - 5.0 g/dL   AST 15 15 - 41 U/L   ALT 16 0 - 44 U/L   Alkaline Phosphatase 66 38 - 126 U/L   Total Bilirubin 0.3 0.3 - 1.2 mg/dL   GFR, Estimated >81 >19 mL/min    Comment: (NOTE) Calculated using the CKD-EPI Creatinine Equation (2021)    Anion gap 12 5 - 15    Comment: Performed at [facility], [address] [place], [place] [address]  CBC     Status: Abnormal   Collection Time: 09/04/22  1:31 AM  Result Value Ref Range   WBC 17.4 (H) 4.0 - 10.5 K/uL   RBC 4.89 3.87 - 5.11 MIL/uL   Hemoglobin 10.5 (L) 12.0 - 15.0 g/dL   HCT 95.6 (L) 21.3 - 08.6 %   MCV 68.3 (L) 80.0 - 100.0 fL   MCH 21.5 (L) 26.0 - 34.0 pg   MCHC 31.4 30.0 - 36.0 g/dL   RDW 57.8 (H) 46.9 - 62.9 %   Platelets 507 (H) 150 - 400 K/uL    Comment: REPEATED TO VERIFY   nRBC 0.0 0.0 - 0.2 %    Comment: Performed at [facility], [address] [place], [place] [address]  hCG, serum, qualitative     Status: None    Collection Time: 09/04/22  1:31 AM  Result Value Ref Range   Preg, Serum NEGATIVE NEGATIVE    Comment:        THE SENSITIVITY OF THIS METHODOLOGY IS >10 mIU/mL. Performed at [facility], [address], [place], [place] [address]     US Abdomen Limited RUQ (LIVER/GB)  Result Date: 09/04/2022 CLINICAL DATA:  Gallbladder dilatation on today's CT with right upper quadrant pain. [id number]. EXAM: ULTRASOUND ABDOMEN LIMITED RIGHT UPPER QUADRANT COMPARISON:  CT with IV contrast earlier today. FINDINGS: Gallbladder: There are few small clustered layering stones in the gallbladder. There is gallbladder dilatation up 12 cm in length but no free wall thickening, positive sonographic Murphy sign, or pericholecystic fluid. Common bile duct: Diameter: 3.1 mm.  No intrahepatic biliary dilatation. Liver: No focal lesion identified. There is mild increased hepatic echogenicity compatible with mild steatosis. Portal vein is patent on color Doppler imaging with normal direction of blood flow towards the liver. Other: None. IMPRESSION: 1. Cholelithiasis with gallbladder dilatation, without evidence of acute cholecystitis. 2. Mild hepatic steatosis. Electronically Signed   By: Almira Bar M.D.   On: 09/04/2022 06:28   CT ABDOMEN PELVIS W CONTRAST  Result Date: 09/04/2022 CLINICAL DATA:  42 year old female with a history of right abdominal pain. Right ovarian dermoid. EXAM: CT ABDOMEN AND PELVIS WITH CONTRAST TECHNIQUE: Multidetector CT imaging of the abdomen and pelvis was performed using the standard protocol following bolus administration  of intravenous contrast. RADIATION DOSE REDUCTION: This exam was performed according to the departmental dose-optimization program which includes automated exposure control, adjustment of the mA and/or kV according to patient size and/or use of iterative reconstruction technique. CONTRAST:  75mL OMNIPAQUE IOHEXOL 350 MG/ML SOLN COMPARISON:  CT Abdomen and Pelvis [id number] and  earlier. FINDINGS: Lower chest: Negative. Hepatobiliary: Distended gallbladder is new. No definite cholelithiasis or pericholecystic inflammation. No bile duct enlargement. Negative liver otherwise. Pancreas: Negative. Spleen: Negative. Adrenals/Urinary Tract: Normal adrenal glands. Symmetric, negative kidneys. Symmetric renal enhancement, no delayed excretory images. No nephrolithiasis. Decompressed ureters. Numerous pelvic phleboliths but unremarkable bladder. Stomach/Bowel: Negative rectum. Diverticulosis of the descending and sigmoid colon with no active inflammation. Mild upstream retained stool. Mildly redundant right colon. Normal appendix on series 3, image 55. Decompressed terminal ileum. No dilated small bowel. Diminutive stomach. Decompressed duodenum. No free air, free fluid or mesenteric inflammation. Vascular/Lymphatic: Suboptimal intravascular contrast bolus but the major arterial structures and portal venous system appear to be patent. Normal caliber abdominal aorta. No lymphadenopathy. Reproductive: Complex right ovarian dermoid with bilobed appearance, nearly 10 cm long axis (series 3, image 67), multi cystic areas plus macroscopic fat and calcification/bone density. Left ovary is within normal limits. There are small exophytic uterine fundal fibroids seen on series 3, image 73, 4.3 cm. No regional inflammation identified. Other: Trace if any pelvis free fluid. Musculoskeletal: Chronic disc degeneration and vacuum disc at the lumbosacral junction. Lower lumbar facet hypertrophy. No acute osseous abnormality identified. IMPRESSION: 1. Distended gallbladder is new from prior CT. No definite cholelithiasis or inflammation by CT. If there is right upper quadrant pain then Ultrasound would be valuable. 2. 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 Smaller subserosal uterine fibroids. 3. Normal appendix. Distal colon diverticular disease with no active inflammation.  Electronically Signed   By: Odessa Fleming M.D.   On: 09/04/2022 04:54      A/P: Dawn Friedman is an 42 y.o. female with HTN with suspected acute cholecystitis  - The anatomy and physiology of the hepatobiliary system was discussed with the patient. The pathophysiology of gallbladder disease was reviewed as well. -The options for treatment were discussed including ongoing observation which may result in subsequent gallbladder complications (infection, pancreatitis, choledocholithiasis, etc), drainage procedures, and surgery - laparoscopic cholecystectomy -The planned procedure, material risks (including, but not limited to, pain, bleeding, infection, scarring, need for blood transfusion, damage to surrounding structures- blood vessels/nerves/viscus/organs, damage to bile duct, bile leak, chronic diarrhea, conversion to a 'subtotal' cholecystectomy and general expectations therein, post-cholecystectomy diarrhea, potential need for additional procedures including EGD/ERCP, hernia, worsening of pre-existing medical conditions, pancreatitis, pneumonia, heart attack, stroke, death) benefits and alternatives to surgery were discussed at length. We have noted a good probability that the procedure would help improve their symptoms. The patient's questions were answered to her satisfaction, her voiced understanding and elected to proceed with surgery. Additionally, we discussed typical postoperative expectations and the recovery process.  I spent a total of 75 minutes in both face-to-face and non-face-to-face activities, excluding procedures performed, for this visit on the date of this encounter.  Marin Olp, MD [facility], [facility]

## 2022-09-05 ENCOUNTER — Encounter (HOSPITAL_COMMUNITY): Payer: Self-pay | Admitting: General Surgery

## 2022-09-05 LAB — SURGICAL PATHOLOGY

## 2022-09-05 NOTE — Anesthesia Postprocedure Evaluation (Signed)
Anesthesia Post Note  Patient: Dawn Friedman  Procedure(s) Performed: LAPAROSCOPIC CHOLECYSTECTOMY (Abdomen)     Patient location during evaluation: PACU Anesthesia Type: General Level of consciousness: awake and alert Pain management: pain level controlled Vital Signs Assessment: post-procedure vital signs reviewed and stable Respiratory status: spontaneous breathing, nonlabored ventilation, respiratory function stable and patient connected to nasal cannula oxygen Cardiovascular status: blood pressure returned to baseline and stable Postop Assessment: no apparent nausea or vomiting Anesthetic complications: no   No notable events documented.  Last Vitals:  Vitals:   09/04/22 1315 09/04/22 1330  BP: (!) 117/58 113/60  Pulse: 64 66  Resp: 19 16  Temp:  36.5 C  SpO2: 95% 100%    Last Pain:  Vitals:   09/04/22 1330  TempSrc:   PainSc: 2                  Collene Schlichter

## 2022-09-14 DIAGNOSIS — Z419 Encounter for procedure for purposes other than remedying health state, unspecified: Secondary | ICD-10-CM | POA: Diagnosis not present

## 2022-10-14 DIAGNOSIS — Z419 Encounter for procedure for purposes other than remedying health state, unspecified: Secondary | ICD-10-CM | POA: Diagnosis not present

## 2022-10-24 DIAGNOSIS — L680 Hirsutism: Secondary | ICD-10-CM | POA: Diagnosis not present

## 2022-10-24 DIAGNOSIS — L408 Other psoriasis: Secondary | ICD-10-CM | POA: Diagnosis not present

## 2022-10-24 DIAGNOSIS — L810 Postinflammatory hyperpigmentation: Secondary | ICD-10-CM | POA: Diagnosis not present

## 2022-11-14 DIAGNOSIS — Z419 Encounter for procedure for purposes other than remedying health state, unspecified: Secondary | ICD-10-CM | POA: Diagnosis not present

## 2022-12-03 IMAGING — US US  BREAST BX W/ LOC DEV 1ST LESION IMG BX SPEC US GUIDE*R*
1 series · 12 of 19 positions shown · non-contrast
Comparison: Previous exam(s).
COMPARISON: Previous exam(s).

Addendum:
CLINICAL DATA: Adjacent 8 mm and 6 mm masses in the 10 o'clock
position of the right breast at recent ultrasound, felt to most
likely correlate with a bilobed mass seen mammographically.

EXAM:
ULTRASOUND GUIDED RIGHT BREAST CORE NEEDLE BIOPSY

[Series 1: us breast bx w/ loc dev 1st lesion img bx spec us  · 0.07mm/px · 12 of 19 slices shown]
[im 1/19]
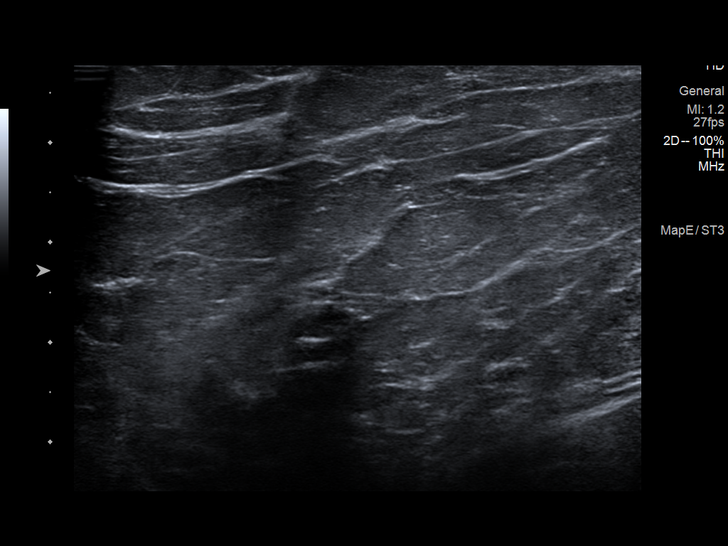
[im 3/19]
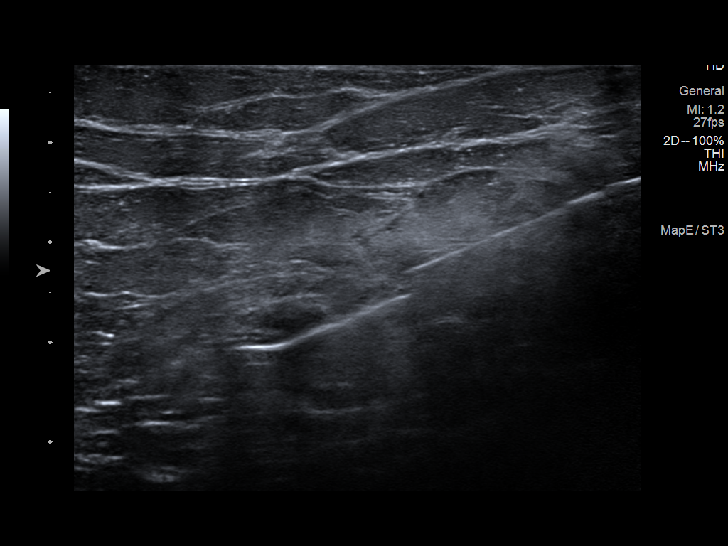
[im 4/19]
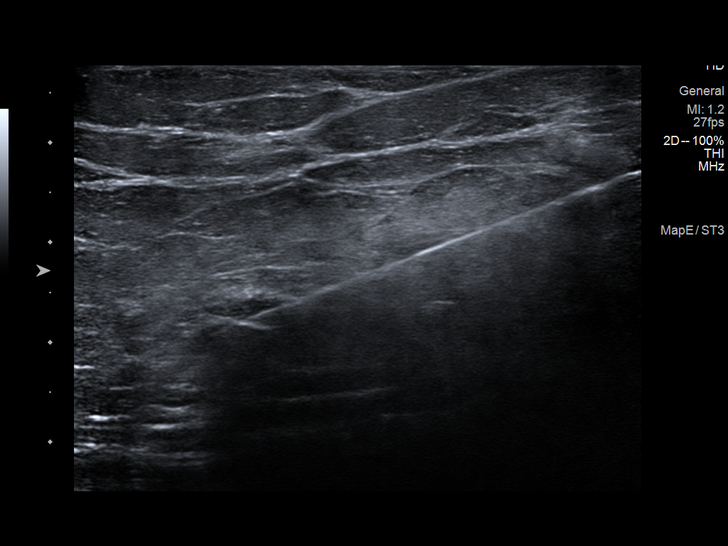
[im 6/19]
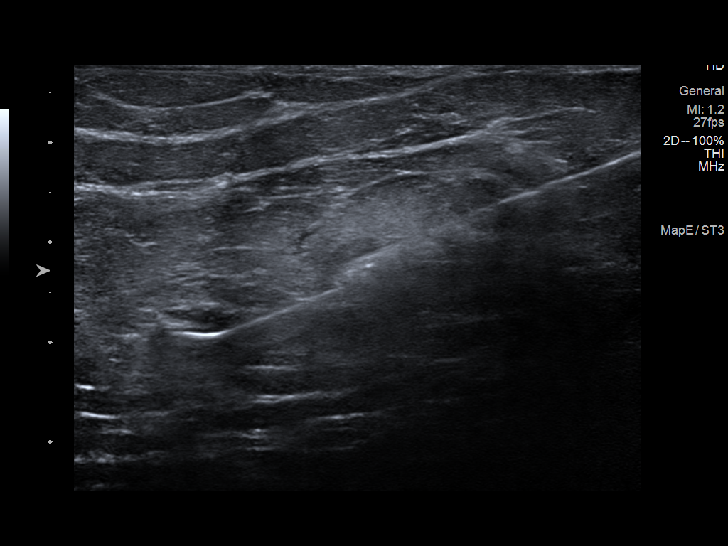
[im 8/19]
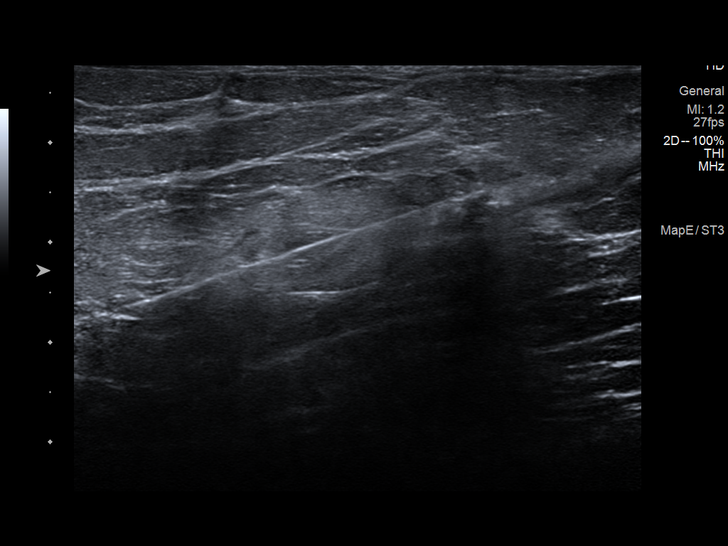
[im 9/19]
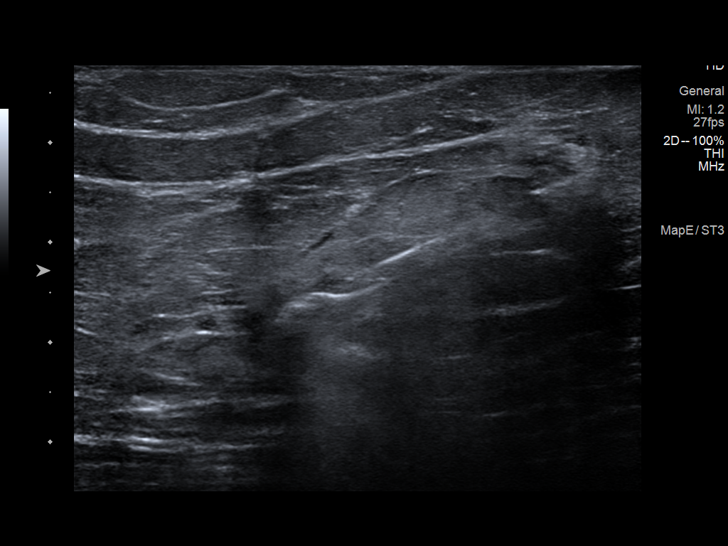
[im 11/19]
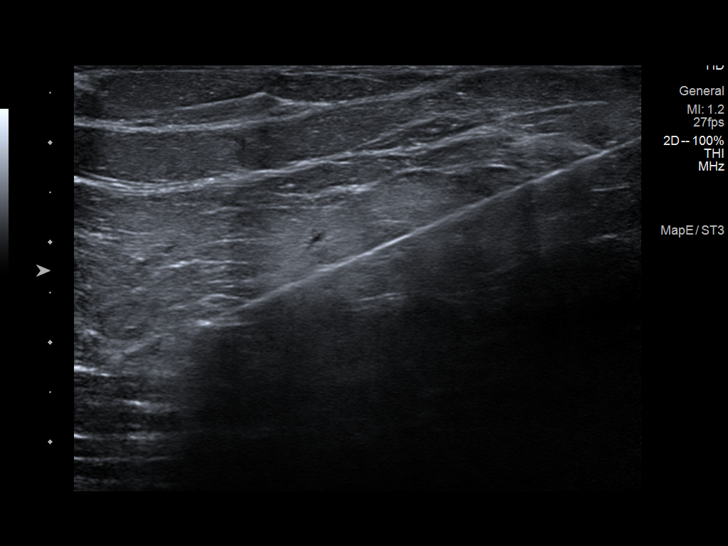
[im 12/19]
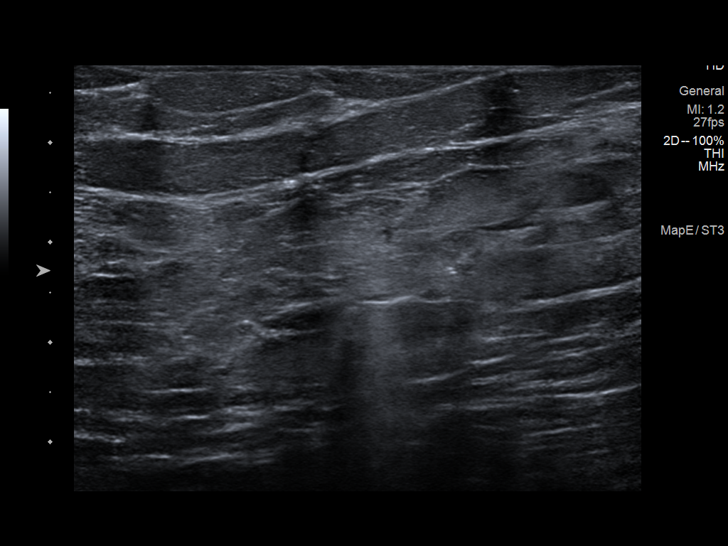
[im 14/19]
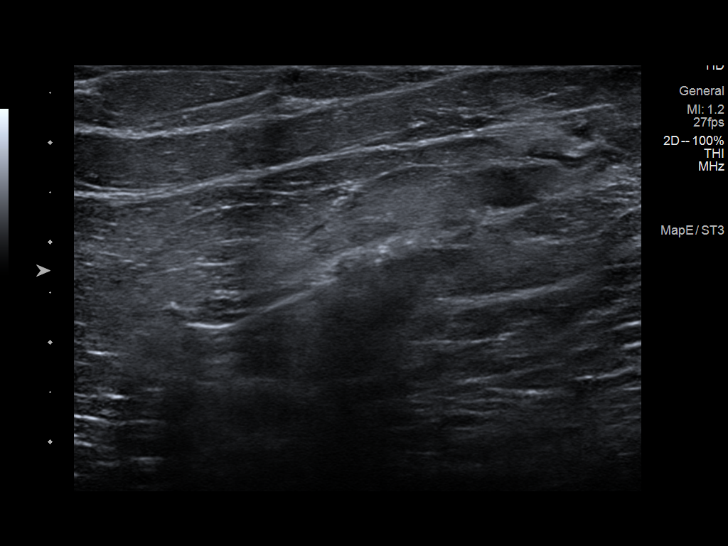
[im 16/19]
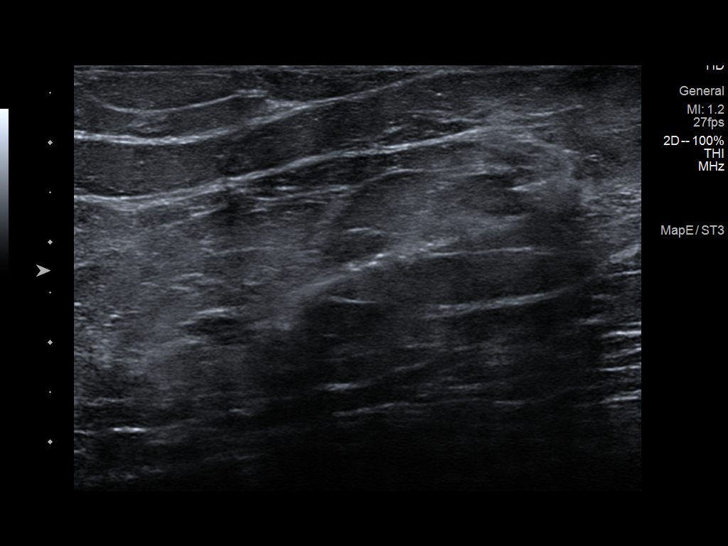
[im 17/19]
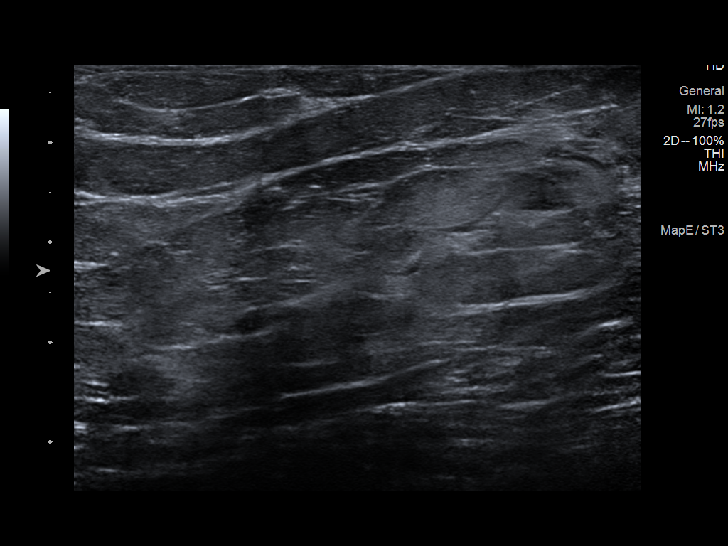
[im 19/19]
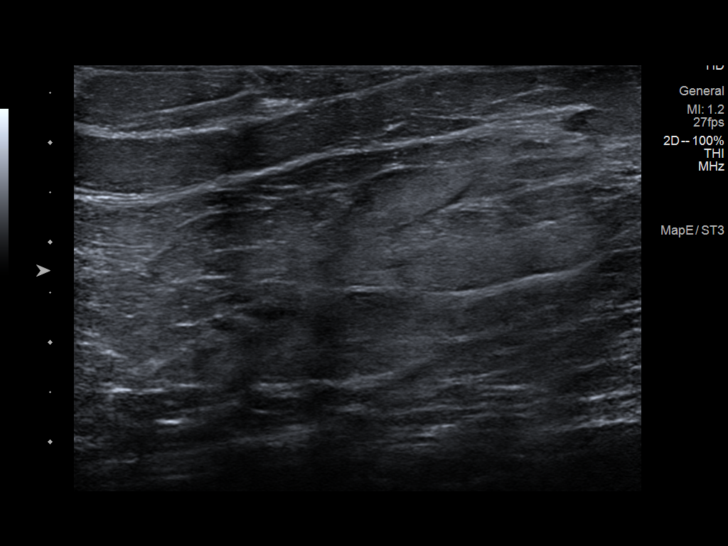

[12 of 19 positions shown; findings below may reference images not displayed]



Lesion quadrant: Upper outer quadrant

Using sterile technique and 1% Lidocaine as local anesthetic, under
direct ultrasound visualization, a 12 gauge Boicho device was
used to perform biopsy of the recently demonstrated 8 mm mass in the
10 o'clock position of the right breast, 4 cm from the nipple, using
a caudal approach. At the conclusion of the procedure a ribbon
shaped tissue marker clip was deployed into the biopsy cavity.
Follow up 2 view mammogram was performed and dictated separately.
IMPRESSION: Ultrasound guided biopsy of the recently demonstrated 8 mm mass in
the 10 o'clock position of the right breast. No apparent
complications.

ADDENDUM:
Pathology revealed BENIGN BREAST PARENCHYMA WITH MILD FIBROCYSTIC
CHANGE AND ADENOSIS- NEGATIVE FOR CARCINOMA of the RIGHT breast, 10
o'clock, 5cmfn. This was found to be concordant by Dr. Klpigbb Moolman.

Pathology results were discussed with the patient by telephone. The
patient reported doing well after the biopsy with tenderness at the
site. Post biopsy instructions and care were reviewed and questions
were answered. The patient was encouraged to call The [REDACTED]

The patient was instructed to return for annual screening
mammography and informed a reminder notice would be sent regarding
this appointment.

Pathology results reported by Frank Emmanuel Aborno RN on 07/12/2020.



Lesion quadrant: Upper outer quadrant

Using sterile technique and 1% Lidocaine as local anesthetic, under
direct ultrasound visualization, a 12 gauge Boicho device was
used to perform biopsy of the recently demonstrated 8 mm mass in the
10 o'clock position of the right breast, 4 cm from the nipple, using
a caudal approach. At the conclusion of the procedure a ribbon
shaped tissue marker clip was deployed into the biopsy cavity.
Follow up 2 view mammogram was performed and dictated separately.
IMPRESSION: Ultrasound guided biopsy of the recently demonstrated 8 mm mass in
the 10 o'clock position of the right breast. No apparent
complications.

## 2022-12-14 DIAGNOSIS — Z419 Encounter for procedure for purposes other than remedying health state, unspecified: Secondary | ICD-10-CM | POA: Diagnosis not present

## 2023-01-02 DIAGNOSIS — N920 Excessive and frequent menstruation with regular cycle: Secondary | ICD-10-CM | POA: Diagnosis not present

## 2023-01-02 DIAGNOSIS — Z809 Family history of malignant neoplasm, unspecified: Secondary | ICD-10-CM | POA: Diagnosis not present

## 2023-01-02 DIAGNOSIS — R87619 Unspecified abnormal cytological findings in specimens from cervix uteri: Secondary | ICD-10-CM | POA: Diagnosis not present

## 2023-01-02 DIAGNOSIS — Z309 Encounter for contraceptive management, unspecified: Secondary | ICD-10-CM | POA: Diagnosis not present

## 2023-01-02 DIAGNOSIS — I10 Essential (primary) hypertension: Secondary | ICD-10-CM | POA: Diagnosis not present

## 2023-01-02 DIAGNOSIS — F329 Major depressive disorder, single episode, unspecified: Secondary | ICD-10-CM | POA: Diagnosis not present

## 2023-01-02 DIAGNOSIS — D259 Leiomyoma of uterus, unspecified: Secondary | ICD-10-CM | POA: Diagnosis not present

## 2023-01-02 DIAGNOSIS — D649 Anemia, unspecified: Secondary | ICD-10-CM | POA: Diagnosis not present

## 2023-01-02 DIAGNOSIS — Z6838 Body mass index (BMI) 38.0-38.9, adult: Secondary | ICD-10-CM | POA: Diagnosis not present

## 2023-01-02 DIAGNOSIS — N83209 Unspecified ovarian cyst, unspecified side: Secondary | ICD-10-CM | POA: Diagnosis not present

## 2023-01-08 DIAGNOSIS — I10 Essential (primary) hypertension: Secondary | ICD-10-CM | POA: Diagnosis not present

## 2023-01-08 DIAGNOSIS — E119 Type 2 diabetes mellitus without complications: Secondary | ICD-10-CM | POA: Diagnosis not present

## 2023-01-08 DIAGNOSIS — Z23 Encounter for immunization: Secondary | ICD-10-CM | POA: Diagnosis not present

## 2023-01-08 DIAGNOSIS — K219 Gastro-esophageal reflux disease without esophagitis: Secondary | ICD-10-CM | POA: Diagnosis not present

## 2023-01-08 DIAGNOSIS — D508 Other iron deficiency anemias: Secondary | ICD-10-CM | POA: Diagnosis not present

## 2023-01-08 DIAGNOSIS — R5383 Other fatigue: Secondary | ICD-10-CM | POA: Diagnosis not present

## 2023-01-09 DIAGNOSIS — Z1231 Encounter for screening mammogram for malignant neoplasm of breast: Secondary | ICD-10-CM | POA: Diagnosis not present

## 2023-01-14 DIAGNOSIS — Z419 Encounter for procedure for purposes other than remedying health state, unspecified: Secondary | ICD-10-CM | POA: Diagnosis not present

## 2023-02-14 DIAGNOSIS — Z419 Encounter for procedure for purposes other than remedying health state, unspecified: Secondary | ICD-10-CM | POA: Diagnosis not present

## 2023-02-18 DIAGNOSIS — J069 Acute upper respiratory infection, unspecified: Secondary | ICD-10-CM | POA: Diagnosis not present

## 2023-02-20 DIAGNOSIS — L408 Other psoriasis: Secondary | ICD-10-CM | POA: Diagnosis not present

## 2023-02-20 DIAGNOSIS — L680 Hirsutism: Secondary | ICD-10-CM | POA: Diagnosis not present

## 2023-03-14 DIAGNOSIS — Z419 Encounter for procedure for purposes other than remedying health state, unspecified: Secondary | ICD-10-CM | POA: Diagnosis not present

## 2023-03-27 DIAGNOSIS — I10 Essential (primary) hypertension: Secondary | ICD-10-CM | POA: Diagnosis not present

## 2023-03-27 DIAGNOSIS — D508 Other iron deficiency anemias: Secondary | ICD-10-CM | POA: Diagnosis not present

## 2023-03-27 DIAGNOSIS — Z0000 Encounter for general adult medical examination without abnormal findings: Secondary | ICD-10-CM | POA: Diagnosis not present

## 2023-03-27 DIAGNOSIS — Z13220 Encounter for screening for lipoid disorders: Secondary | ICD-10-CM | POA: Diagnosis not present

## 2023-03-27 DIAGNOSIS — E559 Vitamin D deficiency, unspecified: Secondary | ICD-10-CM | POA: Diagnosis not present

## 2023-03-27 DIAGNOSIS — E119 Type 2 diabetes mellitus without complications: Secondary | ICD-10-CM | POA: Diagnosis not present

## 2023-04-23 DIAGNOSIS — J029 Acute pharyngitis, unspecified: Secondary | ICD-10-CM | POA: Diagnosis not present

## 2023-04-23 DIAGNOSIS — E119 Type 2 diabetes mellitus without complications: Secondary | ICD-10-CM | POA: Diagnosis not present

## 2023-04-25 DIAGNOSIS — Z419 Encounter for procedure for purposes other than remedying health state, unspecified: Secondary | ICD-10-CM | POA: Diagnosis not present

## 2023-05-25 DIAGNOSIS — Z419 Encounter for procedure for purposes other than remedying health state, unspecified: Secondary | ICD-10-CM | POA: Diagnosis not present

## 2023-06-04 DIAGNOSIS — R202 Paresthesia of skin: Secondary | ICD-10-CM | POA: Diagnosis not present

## 2023-06-04 DIAGNOSIS — R200 Anesthesia of skin: Secondary | ICD-10-CM | POA: Diagnosis not present

## 2023-06-25 DIAGNOSIS — Z419 Encounter for procedure for purposes other than remedying health state, unspecified: Secondary | ICD-10-CM | POA: Diagnosis not present

## 2023-07-15 DIAGNOSIS — E119 Type 2 diabetes mellitus without complications: Secondary | ICD-10-CM | POA: Diagnosis not present

## 2023-07-15 DIAGNOSIS — Z7182 Exercise counseling: Secondary | ICD-10-CM | POA: Diagnosis not present

## 2023-07-15 DIAGNOSIS — I10 Essential (primary) hypertension: Secondary | ICD-10-CM | POA: Diagnosis not present

## 2023-07-15 DIAGNOSIS — Z6837 Body mass index (BMI) 37.0-37.9, adult: Secondary | ICD-10-CM | POA: Diagnosis not present

## 2023-07-15 DIAGNOSIS — E66812 Obesity, class 2: Secondary | ICD-10-CM | POA: Diagnosis not present

## 2023-07-25 DIAGNOSIS — Z419 Encounter for procedure for purposes other than remedying health state, unspecified: Secondary | ICD-10-CM | POA: Diagnosis not present

## 2023-08-21 DIAGNOSIS — N771 Vaginitis, vulvitis and vulvovaginitis in diseases classified elsewhere: Secondary | ICD-10-CM | POA: Diagnosis not present

## 2023-08-21 DIAGNOSIS — N760 Acute vaginitis: Secondary | ICD-10-CM | POA: Diagnosis not present

## 2023-08-25 DIAGNOSIS — Z419 Encounter for procedure for purposes other than remedying health state, unspecified: Secondary | ICD-10-CM | POA: Diagnosis not present

## 2023-09-25 DIAGNOSIS — Z419 Encounter for procedure for purposes other than remedying health state, unspecified: Secondary | ICD-10-CM | POA: Diagnosis not present

## 2023-10-16 DIAGNOSIS — E119 Type 2 diabetes mellitus without complications: Secondary | ICD-10-CM | POA: Diagnosis not present
